# Patient Record
Sex: Female | Born: 1940 | Race: Black or African American | Hispanic: No | Smoking: Never smoker
Health system: Southern US, Community
[De-identification: ages and names within clinical notes are randomized; demographics above are authoritative.]

## PROBLEM LIST (undated history)

## (undated) DIAGNOSIS — I10 Essential (primary) hypertension: Secondary | ICD-10-CM

## (undated) DIAGNOSIS — F419 Anxiety disorder, unspecified: Secondary | ICD-10-CM

## (undated) HISTORY — PX: CHOLECYSTECTOMY: SHX55

## (undated) HISTORY — PX: BACK SURGERY: SHX140

## (undated) HISTORY — PX: JOINT REPLACEMENT: SHX530

---

## 1998-03-19 ENCOUNTER — Emergency Department (HOSPITAL_COMMUNITY): Admission: EM | Admit: 1998-03-19 | Discharge: 1998-03-19 | Payer: Self-pay | Admitting: Emergency Medicine

## 1998-04-07 ENCOUNTER — Ambulatory Visit (HOSPITAL_COMMUNITY): Admission: RE | Admit: 1998-04-07 | Discharge: 1998-04-07 | Payer: Self-pay | Admitting: *Deleted

## 1998-04-12 ENCOUNTER — Ambulatory Visit (HOSPITAL_COMMUNITY): Admission: RE | Admit: 1998-04-12 | Discharge: 1998-04-12 | Payer: Self-pay | Admitting: *Deleted

## 1998-12-13 ENCOUNTER — Ambulatory Visit (HOSPITAL_COMMUNITY): Admission: RE | Admit: 1998-12-13 | Discharge: 1998-12-13 | Payer: Self-pay | Admitting: *Deleted

## 1998-12-26 ENCOUNTER — Ambulatory Visit (HOSPITAL_COMMUNITY): Admission: RE | Admit: 1998-12-26 | Discharge: 1998-12-26 | Payer: Self-pay | Admitting: *Deleted

## 1998-12-26 ENCOUNTER — Encounter: Payer: Self-pay | Admitting: *Deleted

## 1998-12-27 ENCOUNTER — Ambulatory Visit (HOSPITAL_COMMUNITY): Admission: RE | Admit: 1998-12-27 | Discharge: 1998-12-27 | Payer: Self-pay | Admitting: *Deleted

## 1999-01-15 ENCOUNTER — Emergency Department (HOSPITAL_COMMUNITY): Admission: EM | Admit: 1999-01-15 | Discharge: 1999-01-15 | Payer: Self-pay | Admitting: Emergency Medicine

## 1999-02-10 ENCOUNTER — Ambulatory Visit (HOSPITAL_COMMUNITY): Admission: RE | Admit: 1999-02-10 | Discharge: 1999-02-10 | Payer: Self-pay | Admitting: *Deleted

## 1999-03-03 ENCOUNTER — Encounter (HOSPITAL_BASED_OUTPATIENT_CLINIC_OR_DEPARTMENT_OTHER): Payer: Self-pay | Admitting: General Surgery

## 1999-03-08 ENCOUNTER — Ambulatory Visit (HOSPITAL_COMMUNITY): Admission: RE | Admit: 1999-03-08 | Discharge: 1999-03-09 | Payer: Self-pay | Admitting: General Surgery

## 1999-03-10 ENCOUNTER — Emergency Department (HOSPITAL_COMMUNITY): Admission: EM | Admit: 1999-03-10 | Discharge: 1999-03-10 | Payer: Self-pay | Admitting: Emergency Medicine

## 1999-03-13 ENCOUNTER — Emergency Department (HOSPITAL_COMMUNITY): Admission: EM | Admit: 1999-03-13 | Discharge: 1999-03-13 | Payer: Self-pay | Admitting: Emergency Medicine

## 1999-04-12 ENCOUNTER — Encounter: Admission: RE | Admit: 1999-04-12 | Discharge: 1999-04-12 | Payer: Self-pay | Admitting: Neurosurgery

## 1999-05-02 ENCOUNTER — Encounter: Payer: Self-pay | Admitting: Neurosurgery

## 1999-05-02 ENCOUNTER — Ambulatory Visit (HOSPITAL_COMMUNITY): Admission: RE | Admit: 1999-05-02 | Discharge: 1999-05-02 | Payer: Self-pay | Admitting: Neurosurgery

## 1999-05-05 ENCOUNTER — Ambulatory Visit (HOSPITAL_COMMUNITY): Admission: RE | Admit: 1999-05-05 | Discharge: 1999-05-05 | Payer: Self-pay | Admitting: General Surgery

## 1999-05-16 ENCOUNTER — Ambulatory Visit (HOSPITAL_COMMUNITY): Admission: RE | Admit: 1999-05-16 | Discharge: 1999-05-16 | Payer: Self-pay | Admitting: Neurosurgery

## 1999-05-16 ENCOUNTER — Encounter: Payer: Self-pay | Admitting: Neurosurgery

## 1999-06-02 ENCOUNTER — Ambulatory Visit (HOSPITAL_COMMUNITY): Admission: RE | Admit: 1999-06-02 | Discharge: 1999-06-02 | Payer: Self-pay | Admitting: Neurosurgery

## 1999-06-02 ENCOUNTER — Encounter: Payer: Self-pay | Admitting: Neurosurgery

## 1999-06-09 ENCOUNTER — Encounter: Payer: Self-pay | Admitting: Dentistry

## 1999-06-09 ENCOUNTER — Ambulatory Visit (HOSPITAL_COMMUNITY): Admission: RE | Admit: 1999-06-09 | Discharge: 1999-06-09 | Payer: Self-pay | Admitting: Dentistry

## 1999-07-04 ENCOUNTER — Encounter (INDEPENDENT_AMBULATORY_CARE_PROVIDER_SITE_OTHER): Payer: Self-pay | Admitting: *Deleted

## 1999-07-04 ENCOUNTER — Ambulatory Visit (HOSPITAL_COMMUNITY): Admission: RE | Admit: 1999-07-04 | Discharge: 1999-07-04 | Payer: Self-pay | Admitting: General Surgery

## 1999-07-08 ENCOUNTER — Encounter: Payer: Self-pay | Admitting: Emergency Medicine

## 1999-07-08 ENCOUNTER — Emergency Department (HOSPITAL_COMMUNITY): Admission: EM | Admit: 1999-07-08 | Discharge: 1999-07-08 | Payer: Self-pay | Admitting: Emergency Medicine

## 1999-07-17 ENCOUNTER — Encounter: Payer: Self-pay | Admitting: Emergency Medicine

## 1999-07-17 ENCOUNTER — Emergency Department (HOSPITAL_COMMUNITY): Admission: EM | Admit: 1999-07-17 | Discharge: 1999-07-17 | Payer: Self-pay | Admitting: Emergency Medicine

## 1999-09-19 ENCOUNTER — Encounter: Payer: Self-pay | Admitting: Internal Medicine

## 1999-09-19 ENCOUNTER — Ambulatory Visit (HOSPITAL_COMMUNITY): Admission: RE | Admit: 1999-09-19 | Discharge: 1999-09-19 | Payer: Self-pay | Admitting: Internal Medicine

## 2000-01-20 ENCOUNTER — Emergency Department (HOSPITAL_COMMUNITY): Admission: EM | Admit: 2000-01-20 | Discharge: 2000-01-20 | Payer: Self-pay | Admitting: Emergency Medicine

## 2000-01-20 ENCOUNTER — Encounter: Payer: Self-pay | Admitting: Emergency Medicine

## 2000-01-23 ENCOUNTER — Ambulatory Visit (HOSPITAL_COMMUNITY): Admission: RE | Admit: 2000-01-23 | Discharge: 2000-01-23 | Payer: Self-pay | Admitting: *Deleted

## 2000-02-29 ENCOUNTER — Encounter: Payer: Self-pay | Admitting: Orthopedic Surgery

## 2000-02-29 ENCOUNTER — Encounter: Admission: RE | Admit: 2000-02-29 | Discharge: 2000-02-29 | Payer: Self-pay | Admitting: Orthopedic Surgery

## 2000-03-09 ENCOUNTER — Encounter: Admission: RE | Admit: 2000-03-09 | Discharge: 2000-03-09 | Payer: Self-pay | Admitting: Internal Medicine

## 2000-03-11 ENCOUNTER — Encounter: Admission: RE | Admit: 2000-03-11 | Discharge: 2000-03-11 | Payer: Self-pay | Admitting: Internal Medicine

## 2000-03-11 ENCOUNTER — Encounter: Payer: Self-pay | Admitting: Internal Medicine

## 2000-03-14 ENCOUNTER — Ambulatory Visit (HOSPITAL_COMMUNITY): Admission: RE | Admit: 2000-03-14 | Discharge: 2000-03-14 | Payer: Self-pay | Admitting: *Deleted

## 2000-06-13 ENCOUNTER — Encounter: Payer: Self-pay | Admitting: Internal Medicine

## 2000-06-13 ENCOUNTER — Encounter: Admission: RE | Admit: 2000-06-13 | Discharge: 2000-06-13 | Payer: Self-pay | Admitting: Internal Medicine

## 2000-07-18 ENCOUNTER — Encounter: Payer: Self-pay | Admitting: Orthopedic Surgery

## 2000-07-18 ENCOUNTER — Encounter: Admission: RE | Admit: 2000-07-18 | Discharge: 2000-07-18 | Payer: Self-pay | Admitting: Internal Medicine

## 2000-08-27 ENCOUNTER — Ambulatory Visit (HOSPITAL_COMMUNITY): Admission: RE | Admit: 2000-08-27 | Discharge: 2000-08-27 | Payer: Self-pay | Admitting: Orthopedic Surgery

## 2000-09-03 ENCOUNTER — Encounter: Admission: RE | Admit: 2000-09-03 | Discharge: 2000-12-02 | Payer: Self-pay | Admitting: Anesthesiology

## 2000-12-21 ENCOUNTER — Emergency Department (HOSPITAL_COMMUNITY): Admission: EM | Admit: 2000-12-21 | Discharge: 2000-12-21 | Payer: Self-pay | Admitting: Emergency Medicine

## 2000-12-24 ENCOUNTER — Other Ambulatory Visit: Admission: RE | Admit: 2000-12-24 | Discharge: 2000-12-24 | Payer: Self-pay | Admitting: *Deleted

## 2000-12-31 ENCOUNTER — Encounter: Admission: RE | Admit: 2000-12-31 | Discharge: 2000-12-31 | Payer: Self-pay | Admitting: Orthopedic Surgery

## 2000-12-31 ENCOUNTER — Encounter: Payer: Self-pay | Admitting: Orthopedic Surgery

## 2001-01-13 ENCOUNTER — Ambulatory Visit (HOSPITAL_COMMUNITY): Admission: RE | Admit: 2001-01-13 | Discharge: 2001-01-13 | Payer: Self-pay | Admitting: Orthopedic Surgery

## 2001-01-13 ENCOUNTER — Encounter: Payer: Self-pay | Admitting: Orthopedic Surgery

## 2001-01-22 ENCOUNTER — Encounter: Admission: RE | Admit: 2001-01-22 | Discharge: 2001-01-22 | Payer: Self-pay | Admitting: Internal Medicine

## 2001-01-30 ENCOUNTER — Emergency Department (HOSPITAL_COMMUNITY): Admission: EM | Admit: 2001-01-30 | Discharge: 2001-01-31 | Payer: Self-pay | Admitting: Emergency Medicine

## 2001-01-31 ENCOUNTER — Encounter: Payer: Self-pay | Admitting: Emergency Medicine

## 2001-04-14 ENCOUNTER — Emergency Department (HOSPITAL_COMMUNITY): Admission: EM | Admit: 2001-04-14 | Discharge: 2001-04-15 | Payer: Self-pay | Admitting: Emergency Medicine

## 2001-04-15 ENCOUNTER — Encounter: Payer: Self-pay | Admitting: Emergency Medicine

## 2001-05-02 ENCOUNTER — Encounter (INDEPENDENT_AMBULATORY_CARE_PROVIDER_SITE_OTHER): Payer: Self-pay | Admitting: Specialist

## 2001-05-02 ENCOUNTER — Ambulatory Visit (HOSPITAL_COMMUNITY): Admission: RE | Admit: 2001-05-02 | Discharge: 2001-05-02 | Payer: Self-pay | Admitting: *Deleted

## 2001-08-26 ENCOUNTER — Encounter: Payer: Self-pay | Admitting: Family Medicine

## 2001-08-26 ENCOUNTER — Encounter: Admission: RE | Admit: 2001-08-26 | Discharge: 2001-08-26 | Payer: Self-pay | Admitting: Family Medicine

## 2001-08-28 ENCOUNTER — Ambulatory Visit (HOSPITAL_BASED_OUTPATIENT_CLINIC_OR_DEPARTMENT_OTHER): Admission: RE | Admit: 2001-08-28 | Discharge: 2001-08-28 | Payer: Self-pay | Admitting: Urology

## 2001-11-20 ENCOUNTER — Encounter: Admission: RE | Admit: 2001-11-20 | Discharge: 2001-11-20 | Payer: Self-pay | Admitting: Family Medicine

## 2001-11-20 ENCOUNTER — Encounter: Payer: Self-pay | Admitting: Family Medicine

## 2001-11-21 ENCOUNTER — Ambulatory Visit: Admission: RE | Admit: 2001-11-21 | Discharge: 2001-11-21 | Payer: Self-pay | Admitting: *Deleted

## 2001-11-28 ENCOUNTER — Ambulatory Visit (HOSPITAL_COMMUNITY): Admission: RE | Admit: 2001-11-28 | Discharge: 2001-11-28 | Payer: Self-pay | Admitting: *Deleted

## 2002-01-10 ENCOUNTER — Encounter: Payer: Self-pay | Admitting: Emergency Medicine

## 2002-01-10 ENCOUNTER — Emergency Department (HOSPITAL_COMMUNITY): Admission: EM | Admit: 2002-01-10 | Discharge: 2002-01-10 | Payer: Self-pay | Admitting: Emergency Medicine

## 2002-04-09 ENCOUNTER — Ambulatory Visit (HOSPITAL_COMMUNITY): Admission: RE | Admit: 2002-04-09 | Discharge: 2002-04-09 | Payer: Self-pay | Admitting: Internal Medicine

## 2002-04-09 ENCOUNTER — Encounter: Payer: Self-pay | Admitting: Internal Medicine

## 2002-06-15 ENCOUNTER — Encounter: Payer: Self-pay | Admitting: Urology

## 2002-06-15 ENCOUNTER — Ambulatory Visit (HOSPITAL_BASED_OUTPATIENT_CLINIC_OR_DEPARTMENT_OTHER): Admission: RE | Admit: 2002-06-15 | Discharge: 2002-06-15 | Payer: Self-pay | Admitting: Urology

## 2003-10-13 ENCOUNTER — Encounter: Admission: RE | Admit: 2003-10-13 | Discharge: 2003-10-13 | Payer: Self-pay | Admitting: Family Medicine

## 2003-11-27 ENCOUNTER — Encounter: Admission: RE | Admit: 2003-11-27 | Discharge: 2003-11-27 | Payer: Self-pay | Admitting: Orthopedic Surgery

## 2003-12-11 ENCOUNTER — Encounter: Admission: RE | Admit: 2003-12-11 | Discharge: 2003-12-11 | Payer: Self-pay | Admitting: Orthopedic Surgery

## 2003-12-22 ENCOUNTER — Ambulatory Visit (HOSPITAL_BASED_OUTPATIENT_CLINIC_OR_DEPARTMENT_OTHER): Admission: RE | Admit: 2003-12-22 | Discharge: 2003-12-22 | Payer: Self-pay | Admitting: Orthopedic Surgery

## 2003-12-22 ENCOUNTER — Ambulatory Visit (HOSPITAL_COMMUNITY): Admission: RE | Admit: 2003-12-22 | Discharge: 2003-12-22 | Payer: Self-pay | Admitting: Orthopedic Surgery

## 2004-01-04 ENCOUNTER — Encounter: Admission: RE | Admit: 2004-01-04 | Discharge: 2004-03-10 | Payer: Self-pay | Admitting: Orthopedic Surgery

## 2004-02-24 ENCOUNTER — Encounter: Admission: RE | Admit: 2004-02-24 | Discharge: 2004-02-24 | Payer: Self-pay | Admitting: Orthopedic Surgery

## 2004-04-05 ENCOUNTER — Ambulatory Visit (HOSPITAL_BASED_OUTPATIENT_CLINIC_OR_DEPARTMENT_OTHER): Admission: RE | Admit: 2004-04-05 | Discharge: 2004-04-05 | Payer: Self-pay | Admitting: Orthopedic Surgery

## 2004-04-27 ENCOUNTER — Encounter: Admission: RE | Admit: 2004-04-27 | Discharge: 2004-05-15 | Payer: Self-pay | Admitting: Orthopedic Surgery

## 2004-05-10 ENCOUNTER — Encounter: Admission: RE | Admit: 2004-05-10 | Discharge: 2004-05-10 | Payer: Self-pay | Admitting: Gastroenterology

## 2004-05-11 ENCOUNTER — Encounter (INDEPENDENT_AMBULATORY_CARE_PROVIDER_SITE_OTHER): Payer: Self-pay | Admitting: *Deleted

## 2004-05-11 ENCOUNTER — Ambulatory Visit (HOSPITAL_COMMUNITY): Admission: RE | Admit: 2004-05-11 | Discharge: 2004-05-11 | Payer: Self-pay | Admitting: Gastroenterology

## 2004-06-05 ENCOUNTER — Ambulatory Visit (HOSPITAL_COMMUNITY): Admission: RE | Admit: 2004-06-05 | Discharge: 2004-06-05 | Payer: Self-pay | Admitting: Gastroenterology

## 2005-01-16 ENCOUNTER — Encounter: Admission: RE | Admit: 2005-01-16 | Discharge: 2005-01-16 | Payer: Self-pay | Admitting: Orthopedic Surgery

## 2005-02-02 ENCOUNTER — Ambulatory Visit (HOSPITAL_BASED_OUTPATIENT_CLINIC_OR_DEPARTMENT_OTHER): Admission: RE | Admit: 2005-02-02 | Discharge: 2005-02-02 | Payer: Self-pay | Admitting: Orthopedic Surgery

## 2005-02-02 ENCOUNTER — Ambulatory Visit (HOSPITAL_COMMUNITY): Admission: RE | Admit: 2005-02-02 | Discharge: 2005-02-02 | Payer: Self-pay | Admitting: Orthopedic Surgery

## 2005-02-14 ENCOUNTER — Ambulatory Visit (HOSPITAL_COMMUNITY): Admission: RE | Admit: 2005-02-14 | Discharge: 2005-02-14 | Payer: Self-pay | Admitting: Orthopedic Surgery

## 2005-05-18 ENCOUNTER — Encounter: Admission: RE | Admit: 2005-05-18 | Discharge: 2005-05-18 | Payer: Self-pay | Admitting: Family Medicine

## 2005-11-13 ENCOUNTER — Encounter: Admission: RE | Admit: 2005-11-13 | Discharge: 2005-11-13 | Payer: Self-pay | Admitting: Family Medicine

## 2006-04-15 ENCOUNTER — Inpatient Hospital Stay (HOSPITAL_COMMUNITY): Admission: RE | Admit: 2006-04-15 | Discharge: 2006-04-23 | Payer: Self-pay | Admitting: Neurological Surgery

## 2006-04-22 ENCOUNTER — Ambulatory Visit: Payer: Self-pay | Admitting: Physical Medicine & Rehabilitation

## 2006-05-07 ENCOUNTER — Emergency Department (HOSPITAL_COMMUNITY): Admission: EM | Admit: 2006-05-07 | Discharge: 2006-05-07 | Payer: Self-pay | Admitting: Emergency Medicine

## 2006-12-25 ENCOUNTER — Encounter: Admission: RE | Admit: 2006-12-25 | Discharge: 2006-12-25 | Payer: Self-pay | Admitting: Orthopedic Surgery

## 2007-02-10 ENCOUNTER — Ambulatory Visit (HOSPITAL_COMMUNITY): Admission: RE | Admit: 2007-02-10 | Discharge: 2007-02-10 | Payer: Self-pay | Admitting: Orthopedic Surgery

## 2007-02-10 ENCOUNTER — Ambulatory Visit: Payer: Self-pay | Admitting: Vascular Surgery

## 2007-02-10 ENCOUNTER — Encounter (INDEPENDENT_AMBULATORY_CARE_PROVIDER_SITE_OTHER): Payer: Self-pay | Admitting: Orthopedic Surgery

## 2007-03-13 ENCOUNTER — Encounter: Admission: RE | Admit: 2007-03-13 | Discharge: 2007-03-13 | Payer: Self-pay | Admitting: Internal Medicine

## 2007-07-11 ENCOUNTER — Inpatient Hospital Stay (HOSPITAL_COMMUNITY): Admission: RE | Admit: 2007-07-11 | Discharge: 2007-07-14 | Payer: Self-pay | Admitting: Orthopedic Surgery

## 2007-07-13 ENCOUNTER — Encounter (INDEPENDENT_AMBULATORY_CARE_PROVIDER_SITE_OTHER): Payer: Self-pay | Admitting: Orthopedic Surgery

## 2007-07-13 ENCOUNTER — Ambulatory Visit: Payer: Self-pay | Admitting: Vascular Surgery

## 2007-08-05 ENCOUNTER — Encounter: Admission: RE | Admit: 2007-08-05 | Discharge: 2007-08-28 | Payer: Self-pay | Admitting: Orthopedic Surgery

## 2008-06-09 ENCOUNTER — Ambulatory Visit (HOSPITAL_COMMUNITY): Admission: RE | Admit: 2008-06-09 | Discharge: 2008-06-09 | Payer: Self-pay | Admitting: Neurological Surgery

## 2008-07-06 ENCOUNTER — Encounter: Admission: RE | Admit: 2008-07-06 | Discharge: 2008-07-06 | Payer: Self-pay | Admitting: Gastroenterology

## 2008-07-23 ENCOUNTER — Ambulatory Visit (HOSPITAL_COMMUNITY): Admission: RE | Admit: 2008-07-23 | Discharge: 2008-07-23 | Payer: Self-pay | Admitting: Gastroenterology

## 2009-07-21 HISTORY — PX: TENOTOMY / FLEXOR TENDON TRANSFER: SHX6629

## 2009-07-21 HISTORY — PX: CAPSULOTOMY: SHX379

## 2009-07-21 HISTORY — PX: BUNIONECTOMY: SHX129

## 2009-07-21 HISTORY — PX: OTHER SURGICAL HISTORY: SHX169

## 2010-03-16 ENCOUNTER — Encounter: Admission: RE | Admit: 2010-03-16 | Discharge: 2010-03-16 | Payer: Self-pay | Admitting: Orthopedic Surgery

## 2010-03-21 ENCOUNTER — Encounter: Admission: RE | Admit: 2010-03-21 | Discharge: 2010-03-23 | Payer: Self-pay | Admitting: Orthopedic Surgery

## 2010-07-20 ENCOUNTER — Inpatient Hospital Stay (HOSPITAL_COMMUNITY)
Admission: EM | Admit: 2010-07-20 | Discharge: 2010-07-23 | Payer: Self-pay | Source: Home / Self Care | Attending: Internal Medicine | Admitting: Internal Medicine

## 2010-07-21 NOTE — H&P (Signed)
Brooke [name], Brooke [name]               ACCOUNT NO.:  [id number]  MEDICAL RECORD NO.:  [id number]          PATIENT TYPE:  INP  LOCATION:  1826                         FACILITY:  [facility]  PHYSICIAN:  Brooke Dick, MD    DATE OF BIRTH:  07-01-1941  DATE OF ADMISSION:  07/20/2010 DATE OF DISCHARGE:                             HISTORY & PHYSICAL   PRIMARY CARE PHYSICIAN:  Brooke [name]. Brooke [name], M.D.  Patient seen and examined in the emergency room.  CHIEF COMPLAINT:  Chest/epigastric pain.  HISTORY OF PRESENT ILLNESS:  Brooke [name] is a 70 year old pleasant lady with a past medical history significant for gastroesophageal reflux disease, hiatal hernia, fibromyalgia, osteoarthritis, dyslipidemia, osteoporosis and insomnia who also has had multiple surgeries for osteoarthritis including knee replacement, laminectomy at L2, L3, L4, partial lateral meniscectomy.  This patient says she was doing well until about early this morning when she woke up and found that she was having chest pain which was mostly substernal and also slightly epigastric.  She described the chest pain as being a little bit sharp but sometime also felt like some pressure.  She also said the chest pain really got worse when she took a deep breath and because of it she is unable to breathe in deep.  The pain was nonradiating.  Intensity was about 10/10 when it was really bad.  She said she did not have any nausea or vomiting even though she had some nausea when she was given aspirin in the ambulance.  She also noted the fact that the aspirin and the nitroglycerin given to her did not help the pain whatsoever.  This patient denies any paroxysmal nocturnal dyspnea.  Denies orthopnea.  The patient also denies diaphoresis.  REVIEW OF SYSTEMS:  The patient denies dysuria, frequency and urgency. Denies palpitation.  Denies photophobia, diplopia and syncope.  Denies diarrhea but says she has some constipation secondary to  irritable bowel syndrome.  PAST MEDICAL HISTORY:  Osteoarthritis with total right knee replacement, hiatal hernia, fibromyalgia, gastroesophageal reflux disease, dyslipidemia, osteoporosis, insomnia, irritable bowel syndrome with diarrhea alternating with constipation.  MEDICATIONS: 1. Crestor 10 mg daily. 2. Calcium over-the-counter 1 tablet daily. 3. Vitamin D 1 tablet daily. 4. Tramadol 50 mg daily as needed for pain. 5. Carisoprodol 250 mg daily. 6. Xanax 0.5 mg daily at bedtime. 7. Ambien 10 mg daily at bedtime.  ALLERGIES:  The patient is allergic to DEMEROL and CODEINE which causes itching.  She is also mentioned to have allergy to IBUPROFEN and ASPIRIN but both of them only cause nausea and vomiting and I have discussed this with her that this is more a side effect than a true allergy.  SOCIAL HISTORY:  The patient lives alone.  Denies tobacco, alcohol, illicit drug use.  FAMILY HISTORY:  Significant for a father who had a heart attack in his 55s and a brother also had a heart attack but a light one in his 74s, otherwise there is also diabetes mellitus in the family but no high blood pressure, according to her.  PHYSICAL EXAMINATION:  GENERAL:  The patient seen and examined in the emergency room.  She is alert and oriented x3, no acute cardiopulmonary distress. VITAL SIGNS:  Stable except for a heart rate of 102. HEENT:  Normocephalic, atraumatic.  Pupils equal, round, reactive to light.  Extraocular muscles intact.  Nares patent. NECK:  Supple.  No JVD.  No lymphadenopathy or thyromegaly. CHEST:  Clear to auscultation bilaterally.  No rhonchi.  No rales.  No wheezing.  The patient seems to be in pain when she takes in a deep breath and has to stop. ABDOMEN:  Soft, nontender.  No hepatosplenomegaly. EXTREMITIES:  No clubbing or cyanosis.  No edema. CARDIOVASCULAR:  First and second heart sounds only. CENTRAL NERVOUS SYSTEM:  Nonfocal.  Cranial nerves intact II-XII.   The patient has normal reflexes and power is 5/5 globally.  LABS AND INVESTIGATIONS:  WBC 15.8, hemoglobin 12, hematocrit 37, platelet 204 with 81% neutrophils.  D-dimer is 0.70.  Potassium is 3.4, sodium 139, glucose 130, BUN 14, creatinine 0.84.  Troponin less than 0.01. CK-MB 0.9.  Urinalysis shows large leukocyte esterase, 7-10 WBCs and few bacteria.  Chest x-ray is read as normal.  CT angiogram of the chest also done read as no CT findings for pulmonary embolism, normal thoracic aorta except for atherosclerotic changes, coronary artery calcification, no significant pulmonary findings and bibasilar atelectasis noted.  ASSESSMENT: 1. Chest pain, sounds atypical, questionable for pleurisy versus for     gastroesophageal reflux disease. 2. Gastroesophageal reflux disease. 3. Leukocytosis with questionable etiology. 4. Hiatal hernia. 5. Irritable bowel syndrome. 6. Osteoarthritis. 7. Fibromyalgia. 8. Insomnia. 9. Dyslipidemia. 10.Urinary tract infection.  PLAN OF CARE:  This patient will be admitted to the hospital and we are going to treat her empirically.  We will start on IV Protonix for now and then maybe in a day or so transition this to oral Protonix.  We will give this to her twice a day.  I will also hold off on aspirin for now since she is nauseated and this does not seem to be cardiac-related, at least at this point.  In any case, she has got 1 dose of aspirin already and we are in the process of cycling her cardiac enzymes.  If for any reason her cardiac enzymes shows any reason, then she may get the next dose of aspirin which will be due tomorrow anyway.  If cardiac enzymes turn out to be negative, then the rounding MD will now consider inpatient stress test versus outpatient stress test for this patient just to evaluate the cardiac angle fully.  For the patient's urinary tract infection and questionable pleurisy, I am going to give her Levaquin which should cover  both entities and we will take it from there.  I will also ask pharmacy to allow Levaquin for this patient.  A 2-D echo will be done.  DVT prophylaxis will be provided.  We will check lipid profile.  Significantly, she has been ruled out for pulmonary embolism and dissecting aortic aneurysm.    Total time used for admitting this patient is 1 hour.  Plan of care discussed in detail with the patient at length.  She is in agreement and plans to comply.     Brooke Dick, MD     GU/MEDQ  D:  07/20/2010  T:  07/20/2010  Job:  [id number]  cc:   Brooke [name]. Brooke [name], M.D.  Electronically Signed by Brooke [name]  on 07/20/2010 07:00:07 PM

## 2010-07-23 ENCOUNTER — Encounter: Payer: Self-pay | Admitting: Orthopedic Surgery

## 2010-07-24 LAB — DIFFERENTIAL
Basophils Absolute: 0 10*3/uL (ref 0.0–0.1)
Basophils Relative: 0 % (ref 0–1)
Eosinophils Relative: 0 % (ref 0–5)
Monocytes Absolute: 1.3 10*3/uL — ABNORMAL HIGH (ref 0.1–1.0)

## 2010-07-24 LAB — CBC
MCHC: 32.6 g/dL (ref 30.0–36.0)
MCV: 93.8 fL (ref 78.0–100.0)
Platelets: 204 10*3/uL (ref 150–400)
Platelets: 234 10*3/uL (ref 150–400)
RBC: 4.04 MIL/uL (ref 3.87–5.11)
RDW: 12.9 % (ref 11.5–15.5)
WBC: 11.8 10*3/uL — ABNORMAL HIGH (ref 4.0–10.5)

## 2010-07-24 LAB — URINE MICROSCOPIC-ADD ON

## 2010-07-24 LAB — URINALYSIS, ROUTINE W REFLEX MICROSCOPIC
Bilirubin Urine: NEGATIVE
Specific Gravity, Urine: 1.014 (ref 1.005–1.030)
Urobilinogen, UA: 0.2 mg/dL (ref 0.0–1.0)

## 2010-07-24 LAB — CARDIAC PANEL(CRET KIN+CKTOT+MB+TROPI)
CK, MB: 0.9 ng/mL (ref 0.3–4.0)
CK, MB: 1.9 ng/mL (ref 0.3–4.0)
Relative Index: INVALID (ref 0.0–2.5)
Total CK: 81 U/L (ref 7–177)
Total CK: 83 U/L (ref 7–177)
Troponin I: 0.01 ng/mL (ref 0.00–0.06)

## 2010-07-24 LAB — BASIC METABOLIC PANEL
Calcium: 8.3 mg/dL — ABNORMAL LOW (ref 8.4–10.5)
Chloride: 108 mEq/L (ref 96–112)
GFR calc Af Amer: >60 mL/min (ref >60)
GFR calc Af Amer: >60 mL/min (ref >60)
GFR calc non Af Amer: >60 mL/min (ref >60)
Glucose, Bld: 130 mg/dL — ABNORMAL HIGH (ref 70–99)
Potassium: 4.5 mEq/L (ref 3.5–5.1)
Sodium: 139 mEq/L (ref 135–145)
Sodium: 139 mEq/L (ref 135–145)

## 2010-07-24 LAB — CK TOTAL AND CKMB (NOT AT ARMC)
CK, MB: 1 ng/mL (ref 0.3–4.0)
Relative Index: INVALID (ref 0.0–2.5)
Relative Index: 0.8 (ref 0.0–2.5)
Total CK: 83 U/L (ref 7–177)

## 2010-07-24 LAB — MAGNESIUM: Magnesium: 1.9 mg/dL (ref 1.5–2.5)

## 2010-07-24 LAB — D-DIMER, QUANTITATIVE: D-Dimer, Quant: 0.7 ug/mL-FEU — ABNORMAL HIGH (ref 0.00–0.48)

## 2010-07-31 NOTE — Consult Note (Signed)
Brooke [name], [name]               ACCOUNT NO.:  [id number]  MEDICAL RECORD NO.:  [id number]          PATIENT TYPE:  INP  LOCATION:  3740                         FACILITY:  [facility]  PHYSICIAN:  Jesse Sans. Livy Ross, MD, FACCDATE OF BIRTH:  17-May-1941  DATE OF CONSULTATION: DATE OF DISCHARGE:                                CONSULTATION   I was asked by Dr. Toniann Fail of the [facility] to consult on Brooke [name] with chest discomfort.  Brooke [name] is a 70 year old female who has a history of gastroesophageal reflux disease, hiatal hernia, fibromyalgia, chronic insomnia, and hyperlipidemia.  She awoke early the morning of admission on July 20, 2010 with chest discomfort.  It is mostly subxiphoid and epigastric.  The discomfort was sharp.  It is clearly now worse with taking a breath or any movement at all.  There was no radiation discomfort.  There was no nausea or vomiting, diaphoresis or shortness of breath.  She has shortness of breath when she gets up in the hospital to go to the restroom.  She was given 3 nitroglycerin en route by EMS with no help whatsoever.  Her EKG is normal.  Cardiac markers have been negative.  Chest CT was done to rule out pulmonary embolus which was negative.  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  In addition, she had mild atherosclerosis of the aorta but no evidence of aortic dilatation.  There was also some coronary calcification.  There was no pulmonary embolus.  She continues to have pain even at rest, any movement again makes it worse.  PAST MEDICAL HISTORY:  Her cardiac risk factors are pertinent for age, and hyperlipidemia.  She is on Crestor.  Lipitor cause myalgias.  She does not smoke or use any illicit drugs.  No history of diabetes. No premature history of coronary disease.  Her medications on  admission included: 1. Crestor 10 mg a day. 2. Calcium over-the-counter 1 tablet a day. 3. Vitamin D 1 tablet a day. 4. Tramadol 50 mg daily as needed for pain. 5. Carisoprodol 250 mg daily. 6. Xanax 0.5 mg daily at bedtime. 7. Ambien 10 mg daily at bedtime.  She is allergic to DEMEROL and CODEINE which causes itching.  She is intolerant of LIPITOR.  She also cannot tolerate ASPIRIN, IBUPROFEN without having nausea and some sickness on her stomach.  SOCIAL HISTORY:  She lives alone.  She denies any alcohol, tobacco, or illicit drugs.  FAMILY HISTORY:  Significant for father who had a heart attack in his 42s.  Brother had a heart attack in his 35s.  There is a history of diabetes.  PAST SURGICAL HISTORY:  She has had multiple operations for severe osteoarthritis including knee replacement, laminectomy of L2, L3, L4, and a partial lateral meniscectomy.  REVIEW OF SYSTEMS:  Other than the HPI is negative.  She specifically has had no melena or hematochezia.  She does have some problems swallowing at times.  She also denies any hematemesis.  Rest review of systems are  negative.  PHYSICAL EXAMINATION:  GENERAL:  She is a chronically ill-looking lady who is groaning in pain.  She is holding epigastric area, in general is uncomfortable. VITAL SIGNS:  Stable with blood pressure 118/78, her heart rate is 100 and regular.  She is in sinus tach on EKG and telemetry.  Temp was 99.6, sat was 99% on 2 liters. HEENT:  Sclerae nonicteric.  PERRLA.  Extraocular movements intact. Facial symmetry is normal.  Carotids are full without bruits.  Thyroid is not enlarged.  Trachea is midline. NECK:  Supple. LUNGS:  Clear to auscultation and percussion.  No rub. HEART:  Reveals a nondisplaced PMI.  Normal S1 and S2.  No rub.  S2 splits normally.  There is no murmur. ABDOMEN:  She has tender to touch to the lower sternal region, subxiphoid region, and epigastric region.  Bowel sounds are  present. She is nondistended.  I do not perform any deep palpation because she was uncomfortable. EXTREMITIES:  Reveal no cyanosis, clubbing, or edema.  Pulses are intact. NEUROLOGIC:  Intact.  All laboratory data, x-rays including CT angio and EKGs reviewed.  ASSESSMENT: 1. Subxiphoid and epigastric pain and tenderness, expect a combination     of gastroesophageal reflux disease and muscular spasm or pain.  Her     discomfort is not consistent with angina or coronary ischemia.  She     does have coronary calcification, but not surprising in her age     with history of hyperlipidemia.  She clearly has subclinical     coronary artery disease, but I would suspect nonobstructive     disease.  She has mild aortic atherosclerosis as well.  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  I do not feel this as a cause of     pain.  RECOMMENDATIONS: 1. Discontinue IV nitro, aspirin, and heparin. 2. Continue statin. 3. EGD may be indicated if persistent pain continues.  She is on a PPI     now b.i.d. which I agree with.  We will be available for questions.  Thank you for the consultation.   Evynn Boutelle C. Daleen Squibb, MD, [facility]     TCW/MEDQ  D:  07/21/2010  T:  07/22/2010  Job:  [id number]  Electronically Signed by Valera Castle MD [facility] on 07/31/2010 11:00:39 AM

## 2010-08-05 NOTE — Discharge Summary (Signed)
Brooke [name], Brooke [name]               ACCOUNT NO.:  [id number]  MEDICAL RECORD NO.:  [id number]          PATIENT TYPE:  INP  LOCATION:  3740                         FACILITY:  [facility]  PHYSICIAN:  Marinda Elk, M.D.DATE OF BIRTH:  1940-09-07  DATE OF ADMISSION:  07/20/2010 DATE OF DISCHARGE:  07/23/2010                              DISCHARGE SUMMARY   PRIMARY CARE DOCTOR:  [name], M.D.  GASTROENTEROLOGIST:  [name]. [name], MD  DISCHARGE DIAGNOSES: 1. Atypical chest pain, most likely secondary to gastric etiology. 2. Gastroesophageal reflux disease. 3. Hiatal hernia. 4. Urinary tract infection. 5. Fibromyalgia. 6. Hyperlipidemia.  DISCHARGE MEDICATIONS: 1. Tylenol 650 mg q.4 h. p.r.n. 2. Ciprofloxacin 500 mg p.o. b.i.d. 3. Metolazone 25 mg b.i.d. 4. Protonix 40 mg daily. 5. Ambien 10 mg at bedtime. 6. Calcium over-the-counter 1 tablet daily. 7. Carisoprodol 350 mg 1 tablet daily. 8. Crestor 10 mg every evening. 9. Tramadol 50 mg daily. 10.Vitamin D over the counter tablet daily. 11.Xanax 0.5 mg daily.  PROCEDURES PERFORMED: 1. 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  No significant pulmonary findings. 2. Chest x-ray showed no acute cardiopulmonary disease.  CONSULTANTS:  Thomas C. Daleen Squibb, MD, [facility], Cardiology.  BRIEF ADMITTING HISTORY AND PHYSICAL:  This is a 70 year old female with past medical history of GERD, hiatal hernia, fibromyalgia, status post knee replacement, who comes in for chest pain, most likely substernal, slightly epigastric.  She describes the chest pain as being little a bit sharp and sometimes, also felt some pressure.  She says chest pain radiates when she takes deep breath, is nonradiating, 10/10.  She did not have nausea or vomiting.  She also  noted the fact that aspirin and nitroglycerin given to her did not help this.  The patient denies PND. We were asked to admit him to further evaluate.  Please refer to dictation from July 20, 2010, for further details.  BRIEF HOSPITAL COURSE: 1. Atypical chest pain, most likely secondary to GERD, hiatal hernia.     Cardiac enzymes were cycled, they were negative x3.  Her EKG was     done which was normal.  Cardiology was consulted for a second     opinion.  They relayed this was most likely gastric.  She was put     on Protonix PPI.  She has been told to follow up with her primary     care doctor, Dr. Renae Gloss.  We referred her for an EGD as an     outpatient. 2. GERD.  Continue PPI and this was increased to b.i.d.  She has been     told to avoid NSAIDs. 3. UTI.  She was continuing Cipro and cultures are pending at the time     of the dictation. 4. Hiatal hernia.  Continue PPI twice a day.  __________ were made.  On the day of discharge, temperature is 99, pulse of  90, respirations 22, blood pressure 109/79.  She was satting 98% on 2 L.  Labs on the day of discharge show a hemoglobin A1c of 5.5.  TSH of 0.6.     Marinda Elk, M.D.     AF/MEDQ  D:  07/22/2010  T:  07/22/2010  Job:  [id number]  Electronically Signed by Lambert Keto M.D. on 08/05/2010 10:03:56 AM

## 2010-11-14 NOTE — Discharge Summary (Signed)
Brooke [name], [name]               ACCOUNT NO.:  [id number]   MEDICAL RECORD NO.:  [id number]          PATIENT TYPE:  INP   LOCATION:  5039                         FACILITY:  [facility]   PHYSICIAN:  Brooke [name], M.D.    DATE OF BIRTH:  May 31, 1941   DATE OF ADMISSION:  07/11/2007  DATE OF DISCHARGE:  07/14/2007                               DISCHARGE SUMMARY   ADMISSION DIAGNOSIS:  Osteoarthritis, right knee.   DISCHARGE DIAGNOSES:  1. Osteoarthritis, right knee.  2. Fibromyalgia.  3. Hiatal hernia.  4. Gastroesophageal reflux disease.  5. Increased cholesterol.  6. Osteoporosis.  7. Insomnia.   PROCEDURE:  Right total knee arthroplasty.   HISTORY:  A 70 year old female presents with a 5 to 10-year history of  gradual onset of progressive right knee pain.  She has had three knee  arthroscopies on the right, two in 2003 and one in 2008.  She denied any  history of injury or trauma.  Pain is constant, aching.  She has  radiation proximally and distal to the knee.  Difficulty with prolonged  sitting as well as work.  It is decreased with rest.  She does have  night time pain.  Cortisone injections as well as Reflexa with no  relief.  No assistive device at this time.  Using narcotics for pain  relief.  He was admitted for total joint replacement.   HOSPITAL COURSE:  A 70 year old female admitted July 11, 2007, after  appropriate laboratory studies were obtained as well as 1 gram Ancef IV  on call to the operating room.  She  was taken to the operating room  where she underwent a right total knee arthroplasty.  She tolerated the  procedure well.  She was continued on Ancef 1 gram IV q.6 h x3 doses.  Lovenox was started at 30 mg subcu q.12 h on July 12, 2007.  A  Dilaudid PCA pump was used in a reduced protocol.  Celebrex 200 mg p.o.  b.i.d. was begun as well as OxyContin 10 mg p.o. q.12 h.  CPM from 0-90  degrees for 6-8 hours per day was started.  Consults with PT, OT and  care management were made.  Weightbearing as tolerated on the right.  X-  ray of the right knee was ordered also.  Knee immobilizer to the right  knee when out of bed.   The following day, she is allowed out of bed to chair and weaned off of  her O2.  Doppler studies of the right calf were ordered.  These were  negative.  Remainder was hospital course was dedicated to ambulation and  physical therapy.  Once she was ambulatory and stable, she was  discharged.  Vascular studies revealed no evidence of DVT, superficial  thrombosis or Baker cyst.  A knee x-ray from July 11, 2007, revealed  right knee prosthesis in anatomic bone alignment.  Posterior changes are  noted.  Preop hemoglobin was 12.3, hematocrit 36.4%, white count 5500,  platelets 353,000.  Discharge hemoglobin 9.5, hematocrit 27.8%, white  count 7500, platelets 253,000.  Preop sodium 137, potassium  3.3,  chloride 107, CO2 of 25, glucose 172, BUN 9, creatinine 0.89, GFR  greater than 60. Total protein 6.9, albumin 4.1, AST 31, ALT 26,  alkaline phosphatase 66, and total bilirubin 0.5.  Discharge sodium 138,  potassium 3.5, chloride 105, CO2 27, glucose 102, BUN 6, creatinine  0.90, GFR greater than 60.  Urinalysis revealed trace hemoglobin, 0-2  white cells, 0-2 red cells with rare epithelials.  Blood type was O+,  antibody screen positive Anti-B with  IgG negative.   DISCHARGE INSTRUCTIONS:  No restriction on diet.  Keep incision clean  and dry.  Follow blue instruction sheet.  Change dressing daily.  Increase activity as tolerated.  Walker for weightbearing.  She is to be  weightbearing as tolerated.  No lifting or driving for 6 weeks.  CPM 0-  80 degrees, increase daily by 5-10 degrees to 100 degrees maximum.   DISCHARGE MEDICATIONS:  1. Prescription was for Percocet 5/325 one to 520 tablets every 4      hours as needed for pain.  2. Lovenox 40 mg take daily at 8:00 a.m. as instructed.  3. OxyContin 10 mg p.o. q. 12 h.   4. Celebrex 200 mg 1 tablet daily.  5. Zofran over-the-counter 1-2 tablets every 8 hours p.r.n. nausea.   FOLLOWUP:  She is to follow up with Dr. Madelon [name] on July 24, 2007.   Discharged in good condition.      Brooke [name], P.A.-C.      Brooke [name], M.D.  Electronically Signed    BDP/MEDQ  D:  08/11/2007  T:  08/11/2007  Job:  [id number]

## 2010-11-14 NOTE — Op Note (Signed)
NAMEVICTORINE, MCNEE               ACCOUNT NO.:  [id number]   MEDICAL RECORD NO.:  [id number]          PATIENT TYPE:  INP   LOCATION:  2550                         FACILITY:  [facility]   PHYSICIAN:  Dyke Brackett, M.D.    DATE OF BIRTH:  August 14, 1940   DATE OF PROCEDURE:  07/11/2007  DATE OF DISCHARGE:                               OPERATIVE REPORT   PRE-AND-POSTOP DIAGNOSIS:  Osteoarthritis right knee with varus  deformity and flexion contracture.   OPERATION:  Right total knee replacement, LCS cemented, standard patella  femur, 12.5 bearing with a 2.5 tibia.   SURGEON:  Dyke Brackett, M.D.   ASSISTANT:  Arlys John D. Petrarca, P.A.-C.   TOURNIQUET TIME:  Approximately 1 hour.   DESCRIPTION OF PROCEDURE:  Sterile prep and drape, exsanguination of the  leg, and placed in a tourniquet to 350.  A straight skin incision was  made with medial parapatellar approach to the knee made.  There was  varus deformity of the knee with a flexion contracture, requiring  stripping of the medial side of the knee, place release of the PCL.  The  tibia cut was made with a tibial block and a 7-degrees slope, 2.5 mm  below the most diseased medial compartment.  This was followed by an  anterior-posterior femoral cut with eventually the flexion gap measuring  at 12.5 mm, distal 4-degree valgus cut made off the femur, flexion gap  equalized the extension gap.   The attention was next directed to the tibia.  Complete release of the  PCL carried out followed by removal of the remnants of the menisci.  This was followed by cutting the keel hole for a 2.5 size tibia,  followed by a trial in the tibia and femur.  The patella was cut leaving  about 60 mm of native patella for a 3-peg patella.  The trial components  fit nicely with a 12.5 bearing, full extension, no instability noted,  and good stability in flexion.  The trial components were removed.  FloSeal was placed in the posterior capsule, as well as the  anterior  capsule.  Prior to this, the wound was irrigated.  The prosthesis was  inserted with antibiotic impregnated cement tibia, followed by femur  patella allowed to harden.   Trial bearing was placed prior to this, again, stability checked.  The  final components final bearing was placed as well with, again, good  stability noted.  Prior to inserting the tibial insert, the tourniquet  was released.  A small amount of bleeding was encountered posterior, it  was coagulated.  No excessive bleeding was noted.  Hemovac drain was  placed exiting superolaterally.  Closure was effected with interrupted  Ethibond followed by 2-0 Vicryl, and skin clips.  Placed in a lightly  compressive sterile dressing and knee immobilizer, taken to recovery  room in stable condition.      Dyke Brackett, M.D.  Electronically Signed    WDC/MEDQ  D:  07/11/2007  T:  07/11/2007  Job:  [id number]

## 2010-11-17 NOTE — Procedures (Signed)
[place]  Patient:    Brooke [name], Brooke [name]                      MRN: [id number] Proc. Date: 01/23/00 Adm. Date:  [id number] Attending:  Sharyn Dross                           Procedure Report  PREOPERATIVE DIAGNOSIS:  Dysphagia.  POSTOPERATIVE DIAGNOSIS: 1. Distal esophagitis, grade 1 to grade 2. 2. Hiatal hernia. 3. Antral gastritis with some erosions.  PROCEDURE PERFORMED:  Esophagogastroduodenoscopy.  MEDICATIONS USED:  Demerol 100 mg IV, Versed 10 mg IV over a 20 minute period of time.  INSTRUMENT USED:  Olympus video panendoscope.  ENDOSCOPIST:  Sharyn Dross., M.D.  INDICATIONS FOR PROCEDURE:  This pleasant female presented to the office with complaints of difficulty swallowing at this time.  She has generalized abdominal pains that was present as well as feeling of sensation of food getting stuck in the chest region at this time.  She was subsequently referred for an endoscopic examination to evaluate this process that was ongoing.  OBJECTIVE FINDINGS:  She is a pleasant female in no distress.  Her vital signs are stable.  Her HEENT examination is anicteric.  The neck was supple.  Lungs were clear.  The heart had a regular rate and rhythm without heaves, thrills, murmurs or gallops.  The abdomen was soft, mild tenderness to palpation of the superumbilical region without rebound or referred tenderness noted.  There is also infraumbilical discomforts that was appreciated.  The extremities were within normal limits.  PLAN:  I am going to proceed with the endoscopic examination.  INFORMED CONSENT:  The patient was advised of the procedure, indications, and the risks involved.  The patient has agreed to have the procedure performed at this time.  A video was reviewed and the consent form was obtained.  PREOPERATIVE PREPARATION:  The patient was brought to the endoscopy unit where an IV for IV sedating medication was started.  A  monitor was placed on the patient to monitor the patients vital signs and oxygen saturation.  Nasal oxygen at 2 L per minute was used and after adequate sedation was performed, the procedure was begun.  DESCRIPTION OF PROCEDURE:  The instrument was advanced with the patient in the left lateral position via the direct technique without difficulty.  The oropharyngeal epliglottis, vocal cords and piriform sinuses appeared to be grossly within normal limits.  The esophagus showed evidence of distal esophagitis that was present at this time.  There appeared to be grade 1 to grade 2 characteristic that was also noted.  There appeared to be also a relaxed lower esophageal sphincter versus a hiatal hernia that was noted at this time.  There was no evidence of any ulcerations in the proximal portion of the esophagus that was noted nor any evidence of any varices noted.  The gastric area showed evidence of a normal mucous lake that was appreciated. There was evidence of gastritis changes in the body but more prominent in the antral area that was noted.  Photographs were taken of the region at this time.  The pylorus appeared to be normal with decreased peristaltic activity that was noted.  Upon advancing to the pyloric orifice, the duodenal bulb and second portion appeared to be within normal limits.  The instrument was retracted back where a retroflex view of the cardia showed evidence  of relaxed lower esophageal sphincter but also consistent with a hiatal hernia that was noted.  Funduscopic region appeared to be within normal limits.  The instrument was retracted back where the Z-line appeared to be approximately 34 to 35 cm distal esophagus that was noted.  The instrument was retracted back in the area with no evidence of any postendoscopic trauma noted.  The instrument was subsequently removed per orum without difficulty.  The patient tolerated the procedure well.  TREATMENT: 1. I recommend  treating the patient with a proton pump inhibitor at this time.    This should help the area to improve at this time. 2. Would recommend repeating the procedure in approximately two months to    determine the rate of healing for the process. depending on the results to    determine the course of therapy. DD:  01/23/00 TD:  01/24/00 Job: [id number]

## 2010-11-17 NOTE — Procedures (Signed)
[facility]  Patient:    Brooke [name], Brooke [name]                      MRN: [id number] Proc. Date: 09/05/00 Adm. Date:  [id number] Attending:  Thyra Breed CC:         Sharlot Gowda., M.D.   Procedure Report  PROCEDURE:  Right lumbar sympathetic block.  DIAGNOSIS:  Right knee pain, suspected complex regional pain syndrome.  ANESTHESIOLOGIST:  Thyra Breed, M.D.  INTERVAL HISTORY:  Brooke [name] is a 70 year old who is sent to [place] by Dr. Frederico Hamman for a lumbar sympathetic block for right knee pain. 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  Postoperatively, the patient developed a sharp, stabbing, nagging discomfort over the medial aspect of the knee which, with bending, took on more of a sharp, pulling type sensation.  Whenever she would turn from side to side, the pain would increase.  It has been associated with swelling and increased warmth but no coldness.  She applies ice which hurts initially, but once it is taken off, it feels better.  She feels as though there is some numbness under the knee cap.  She denied any bowel or bladder incontinence.  She has been treated with physical therapy with no overall improvement, and she takes Percocet which she states 2 tablets at a time does not affect the pain at all.  She had an MRI preop of her knee which showed an inferior articular surface tear of the posterior horn of the medial meniscus and chondromalacia patellae.  CURRENT MEDICATIONS:  Premarin, alprazolam, Ambien, Allegra, Percocet, Lipitor, and Nexium.  ALLERGIES:  PENICILLIN.  FAMILY HISTORY:  Positive for diabetes mellitus, coronary artery disease, cancer, osteoarthritis, and elevated cholesterol.  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  She has also undergone polypectomies.  ACTIVE MEDICAL PROBLEMS:  Allergic rhinitis, osteoarthritis, hypercholesterolemia. gastroesophageal reflux disease.  She has been diagnosed as having fibromyalgia by her family practitioner and polyps.  SOCIAL HISTORY:  The patient is a nonsmoker, nondrinker.  She has been on disability for four years due to articular pain.  She moved to [facility] from [place].  REVIEW OF SYSTEMS:  General: Negative.  Head: Significant for migraines which have not been a recent problem.  Eyes: Significant for corrective lens.  Nose, Mouth, Throat: Significant for allergic rhinitis.  Ears: Negative.  Pulmonary: Negative.  Cardiovascular: Significant for some heart fluttering recently for which she sees her primary care physician.  GI: Significant for gastroesophageal reflux disease which is responsive to Nexium.  GU: Negative. Neurologic: No history of seizure or stroke.  See HPI for pertinent positives. Musculoskeletal: See HPI.  Hematologic: Negative.  Cutaneous: Negative. Endocrine: Negative.  Psychiatric: Positive for depression, and that is why she takes Xanax.  Allergy and Immunologic: Seasonal allergies are recurrent problem.  PHYSICAL EXAMINATION:  VITAL SIGNS:  Blood pressure 157/74, heart rate 74, respiratory rate 18, O2 saturation 99%, pain level 9/10, temperature 97.4.  GENERAL:  This is a pleasant, anxious female in no acute distress.  HEENT:  Head normocephalic, atraumatic.  Eyes: Extraocular movements intact with conjunctivae and sclerae clear.  Nose: Patent nares.  Oropharynx  free of lesions.  NECK:  Supple without lymphadenopathy.  Carotids 2+ and symmetric without bruits.  LUNGS:  Clear.  HEART:  Regular rate and rhythm.  BREASTS/ABDOMINAL/PELVIC/RECTAL:  Exams were not performed.  BACK:  No tenderness to  percussion about the vertebrae with negative straight leg raise signs.  EXTREMITIES:  Upper extremities demonstrate evidence of previous surgery of the left elbow.  Radial pulses 2+ and symmetric.  Lower extremities demonstrate 2+ and symmetric dorsalis pedis pulses with tenderness of the right knee and swelling with increased warmth.  She had inability to extend the knee completely with about a 10 degree limitation with flexion to at least 110 degrees with pain along the medial aspect on flexion.  On rotation of the knee, she had a clicking type sensation over the patella to the femur.  She was especially tender along the medial joint space line.  NEUROLOGIC:  The patient was oriented x 4.  Cranial nerves II-XII grossly intact.  Deep tendon reflexes were symmetric in the upper and lower extremities with downgoing toes.  Motor was 5/5, although she had tenderness on resisted extension of the right knee.  Bulk and tone were symmetric. Sensation was intact to pinprick, light touch, and vibratory sense. Coordination was grossly intact.  IMPRESSION: 1. Right knee pain which is different in character to her preoperative pain    which seems sharper.  This is associated with increased warmth. 2. Other medical problems per primary care physician which include anxiety,    depression, allergic rhinitis, osteoarthritis, hypercholesterolemia,    gastroesophageal reflux disease, and questionable history of fibromyalgia.  DISPOSITION:  I discussed the potential risks, benefits, and limitations of a lumbar sympathetic block in detail with the patient as well as with her boyfriend.  Their questions were answered.  PROCEDURE:  After informed consent was obtained, the patient was taken to the thoracoscopy suite where she had an IV established in her right upper extremity and monitors placed.  She was placed in the prone position with pillow under her abdomen.   Using fluoroscopic guidance, I  identified lumbar  vertebrae L1 through L5 and marked the spinous processes.  At 7 cm lateral to the spinous process of L2, a mark was made on the skin.  The skin was was prepped with Betadine x 3.  The skin was draped out.  The patient was sedated with Versed and fentanyl. She received a total of 1 mg of Versed and 50 mcg of fentanyl.  Using a 25-gauge needle, I anesthetized the skin and subcutaneous structures with 3 cc of 1 Lidocaine.  A 20-gauge Chiba needle was introduced to the anterolateral aspect of L3 by AP, lateral, oblique projections. Aspiration was negative for blood and CSF.  I injected 20 ml of 1% chloroprocaine in 1 ml increments with negative aspirates between each increment. The patient noted increased warmth in her foot and decreased foot. Then 20 ml of 0.25% bupivacaine was injected in 1 ml increments with negative aspirates between each increment.  The needle was flushed with 3 cc of air and removed intact.  During the course of the procedure, the patient did note a paresthesia into the anterior aspect of her right thigh.  At that point, the needle was adjusted to avoid any further stimulation of that nerve.  POSTPROCEDURE CONDITION:  The patient noted some discomfort in her right thigh which abated within 15 to 20 minutes to a significant extent.  Her vital signs were stable.  Her pain was markedly  decreased.  The response to the injection suggests that she does have a sympathetic component to her knee pain.  DISCHARGE INSTRUCTIONS: 1. Resume previous diet. 2. Limitation of activities per instruction sheet as outlined by my    assistant today. 3. Continue on current medications. 4. Follow up with me next week for repeat injection. DD:  09/05/00 TD:  09/06/00 Job: [id number]

## 2010-11-17 NOTE — Consult Note (Signed)
Brooke [name], Brooke [name]               ACCOUNT NO.:  [id number]   MEDICAL RECORD NO.:  [id number]          PATIENT TYPE:  INP   LOCATION:  3106                         FACILITY:  [facility]   PHYSICIAN:  Lonia Blood, M.D.DATE OF BIRTH:  [birth year]   DATE OF CONSULTATION:  04/17/2006  DATE OF DISCHARGE:                                   CONSULTATION   PHYSICIAN REQUESTING CONSULTATION:  Dr. Barnett Abu.   PRIMARY CARE PHYSICIAN:  Dr. Kellie Shropshire.   REASON FOR CONSULTATION:  A fever of unknown origin.   HISTORY OF PRESENT ILLNESS:  Ms. Brooke [name] is a 70 year old female who  is followed in the outpatient setting by Dr. Kellie Shropshire.  She has a  longstanding history of a complex diffuse pain syndrome.  She is found to be  suffering with significant spondylotic changes at the L3-L4 and L4-L5  levels.  She was admitted to the hospital by Dr. Barnett Abu on April 15, 2006 and underwent laminectomy to attempt to ameliorate her symptoms.  The  immediate postoperative period has been without complications.  In the last  24 hours, however, she has begun to develop significant nausea.  This has  been accompanied by vomiting on 1 to 2 occasions.  She is unable to tolerate  p.o. intake of pills or nutrition.  She has a low-grade fever at 101.2.  She  complains of crampy lower bilateral quadrant abdominal pain.  This is crampy  in nature.  It does not radiate.  It is associated with almost constant  nausea.  The patient also reports a generalized headache.  She furthermore  complains of significant pain in the back as would be expected in a patient  who is immediately postop.  Yesterday, she was somewhat sedated but this was  felt to be due concomitant administration of Lyrica, Ativan, Phenergan, and  PCA.  The patient's narcotic PCA has been discontinued.   REVIEW OF SYSTEMS:  Comprehensive review of systems was carried out and is  unremarkable with the exception of multiple positive  elements noted in the  history of present illness above.   PAST MEDICAL HISTORY:  1. Lumbar spondylosis with spondylolisthesis status post laminectomy      April 15, 2006.  2. Left knee osteoarthritis status post lateral meniscectomy and debriding      chondroplasty.  3. Status post right ring finger trigger finger release.  4. Hiatal hernia diagnosed by EGD.  5. Status post carpal tunnel release of the left wrist treated in October      2005 and on the right treated in June 2005.  6. Rotator cuff tear status post correction of bilateral shoulders.  7. Nephrolithiasis status post lithotripsy in 2003.  8. Complex diffuse pain syndrome.  9. Hypercholesterolemia.  10.Status post hysterectomy.   OUTPATIENT MEDICATIONS:  1. Xanax 0.5 mg daily.  2. Zolpidem 10 mg nightly.  3. Lyrica 25 mg b.i.d.  4. Phenergan 25 mg p.o. p.r.n.  5. Oxycodone 10 mg b.i.d. p.r.n.  6. Lipitor 40 mg daily.  7. Zegerid 40 mg p.o. b.i.d.  8. Sucralfate 1  g p.o. b.i.d.   ALLERGIES:  No known drug allergies.   FAMILY HISTORY:  The patient's mother died at the age of 36 with  Alzheimer's, diabetes, and hypertension. Family history otherwise reviewed  and is unremarkable.   SOCIAL HISTORY:  The patient does not smoke nor has she ever.  She does not  drink nor has she ever.  She lives in [place].  She is not married but  has an ongoing boyfriend.   DATE REVIEW:  Temperature 101.2, blood pressure 115/35, heart rate 95 to  125, respiratory rate 17, O2 sats 99% on 2 L per nasal cannula.  I and O's  are positive for 1126 cc over the last 24 hours.  A hemoglobin is low at 8.9  with a preop hemoglobin of 12.8.  White count is elevated at 17,000 with  preop white count of 5.7.  Platelet count is 206.  MCV is 96.  Sodium,  potassium, chloride, bicarb, BUN, and creatinine are normal.  Calcium is 7.5  Serum glucose is elevated at 186.   PHYSICAL EXAM:  VITALS:  As noted above.  GENERAL:  Well-developed,  well-nourished female who is somewhat lethargic at  present and appears to be in pain and holding her lower abdomen but is in no  acute respiratory distress.  HEENT:  Normocephalic, atraumatic.  Pupils equal round and reactive to light  and accommodation.  Extraocular muscles are intact bilaterally.  OC/OP  clear.  NECK:  No JVD.  No lymphadenopathy.  LUNGS:  Clear to auscultation bilaterally without wheezes or rhonchi with  the exception of mild bibasilar crackles.  CARDIOVASCULAR:  Sinus tachycardia at present at approximately 105 beats per  minute without murmur, gallop, or rub with regular rhythm.  ABDOMEN:  Mildly overweight, soft.  Bowel sounds are present.  The patient  is tender to deep palpation in the bilateral lower quadrants and much more  so in the suprapubic region.  There is no rebound.  There is no ascites.  The patient is not distended.  EXTREMITIES:  No significant cyanosis, clubbing, edema to the bilateral  lower extremities.  NEUROLOGIC:  The patient is lethargic but alert.  She is oriented to person,  place, and time.  She moves all 4 extremities spontaneously.  Cranial nerves  II through XII are intact bilaterally.   RECOMMENDATIONS:  1. Fever of unknown origin - the patient's fever of unknown origin is      likely related to a pyelonephritis.  She has an elevated white blood      cell count and an elevated fever.  She has a history of nephrolithiasis      and urinary tract instrumentation.  This would put her at an increased      risk for recurrent UTIs.  Given her abdominal discomfort and the      absence of clinical signs to suggest an ileus, I feel that      pyelonephritis is the most likely etiology.  I will send urine for      urinalysis and culture.  I will send blood cultures as well.  I have      not inspected the surgical wound but have spoken with the attending on     the case who reports that he did inspect the wound this morning and      that there  were no signs of infection there.  Bacteremia is also a      consideration in any patient who  has recently undergone surgery.  I      will place the patient on empiric Rocephin as I do feel it is likely an      urinary tract infection.  If urinalysis is unremarkable, we will need      to consider other options.  Chest x-ray will be followed up in the      morning.  KUB will also be obtained.  I am not very concerned about an      ileus, however, as clinical exam is not consistent with such.  2. Hyperglycemia - the patient has a significant elevated serum glucose.      She has no history of diabetes.  She has been dosed with Decadron      during her hospital stay due to her chronic back trouble and      predictable perioperative swelling.  We will place the patient on a      sliding scale insulin to follow her closely due to her hospital stay to      attempt to prevent any further complications that could be associated      with hyperglycemia in this setting.  3. Hiatal hernia.  The patient has a known significant hiatal hernia.  She      is on Zegerid as well as Carafate at home.  I will hold the patient's      p.o. medicines at present as she is not tolerating them well.  I will,      however, place the patient on maximum dose of proton-pump inhibitor via      the IV route.  We will follow her symptoms.  4. Acute blood loss anemia - the patient's baseline hemoglobin is 12.8 as      obtained prior to her surgery.  Her hemoglobin is presently 8.9.  In      this patient, we would likely maintain a hemoglobin of greater than      8.0.  We will follow her CBC in the morning and if she drops below 8,      we will consider transfusion.  At the present time, there is no      indication for emergent transfusion.   Thank you very much for your consultation on this pleasant lady.  We are  happy to continue to follow along with you.      Lonia Blood, M.D.  Electronically Signed      JTM/MEDQ  D:  04/17/2006  T:  04/17/2006  Job:  [id number]   cc:   Merlene Laughter. Renae Gloss, M.D.

## 2010-11-17 NOTE — Procedures (Signed)
[place]  Patient:    Brooke [name], Brooke [name] Visit Number: [id number] MRN: [id number]          Service Type: Attending:  Sharyn Dross., M.D. Dictated by:   Sharyn Dross., M.D. Proc. Date: 11/21/01                             Procedure Report  PROCEDURE PERFORMED:  ENDOSCOPIST:  Sharyn Dross., M.D.  INDICATIONS FOR PROCEDURE:  This 70 year old female presented with dysphagia that has been noted over the past few weeks.  She has had previous exams for similar problems in the past and was found to have gastroduodenitis which was treated conservatively at this time.  She has not been seen for the past several months but had recurrent complaints of having difficulty swallowing that was present.  She also complained of heartburn and discomforts that was noted.  She denies any history of any hematemesis or melanotic stools that was present.  OBJECTIVE FINDINGS:  The patient is a very pleasant female who appears to be in no acute distress.  Her vital signs are stable.  Her HEENT examination is anicteric.  The neck was supple.  The lungs were clear to auscultation and percussion.  The heart had a regular rate and rhythm without heaves, thrills, murmurs or gallops.  The abdomen was soft.  There was mild tenderness to palpation.  No asymmetry appreciated.  Bowel sounds are present.  The extremities showed no clubbing, cyanosis or edema.  GROSS IMPRESSION: 1. Dysphagia. 2. Heartburn. 3. Rule out possibility of gastroesophageal reflux disease. 4. Rule out Barretts esophagus. 5. Rule out motility discomforts that is present at this time.  PRESENT RECOMMENDATIONS:  I am going to proceed with the endoscopic examination today and depending on the results to determine the course of therapy.  INFORMED CONSENT:  The patient was advised of the procedure, indications, and the risks involved.  The patient has agreed to have the procedure performed  at this time.  PREOPERATIVE PREPARATION:  The patient was brought to the [facility] endoscopy unit where an IV for IV sedating medication was started.  A monitor was placed on the patient to monitor the patients vital signs and oxygen saturations. Oxygen via nasal cannula was used at 2L per minute and after adequate sedation was performed, the procedure was begun.  DESCRIPTION OF PROCEDURE:  The instrument was advanced with the patient in the left lateral position via the direct technique without difficulty.  The oropharyngeal epiglottis, vocal cords and piriform sinuses appeared to be grossly within normal limits.  The esophagus was normal without any evidence of acute inflammation, ulcerations, hiatal hernias or varices appreciated.  The gastric area showed a normal mucous lake without any evidence of acute inflammation or ulcerations that was noted at this time.  The antral area appeared to be unremarkable with specks of inflammation in the antral area that was noted at this time.  Photographs were taken.  The pylorus was normal with good peristaltic activity.  Upon advancement through the pyloric canal, the duodenal bulb and second portion appeared to be within normal limits.  As the instrument was retracted back the biopsies for the CLO study was noted and again mild inflammation was noted at this time.  Retroflex view of the cardia revealed evidence of specks of heme that was present from this region that was noted.  There was no evidence of any hiatal  hernia or relaxed lower esophageal sphincter appreciated.  The Z-line however, appeared to be approximately 36 cm distal esophagus that was noted.  There was also noted as the instrument was retracted back, of grade A or 1 evidence of distal esophagitis that was noted. No evidence of any acute heme that was present.  The rest of the esophagus again appeared to be unremarkable as the instrument was removed per orum.  The patient  tolerated the procedure well.  TREATMENT: 1. I am going to start the patient with a proton pump inhibitors at this time. 2. Will await the results of the CLO study and depending on the results will  determine the course of therapy.  She is to follow up with me in two weeks. Dictated by:   Sharyn Dross., M.D. Attending:  Dortha Kern, Montez Hageman., M.D. DD:  11/21/01 TD:  11/24/01 Job: [id number]

## 2010-11-17 NOTE — Op Note (Signed)
NAME:  Brooke [name], Brooke [name]                   ACCOUNT NO.:  [id number]   MEDICAL RECORD NO.:  [id number]          PATIENT TYPE:  AMB   LOCATION:  DSC                          FACILITY:  [facility]   PHYSICIAN:  Thera Flake., M.D.DATE OF BIRTH:  05-29-1941   DATE OF PROCEDURE:  04/05/2004  DATE OF DISCHARGE:                                 OPERATIVE REPORT   PREOPERATIVE DIAGNOSES:  1.  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  2.  For the shoulder, complete rotator cuff tear with degenerative tear and      anterosuperior labrum impingement, AC joint arthritis.   POSTOPERATIVE DIAGNOSES:  1.  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  2.  For the shoulder, complete rotator cuff tear with degenerative tear and      anterosuperior labrum impingement, acromioclavicular joint arthritis.   PROCEDURE:  Hand:  1.  Carpal tunnel release, hand.  2.  Synovectomy.   Shoulder:  1.  Open rotator cuff repair, acromioplasty.  2.  Arthroscopic debridement, torn labrum.   SURGEON:  Dyke Brackett, M.D.   ASSISTANT:  Jamelle Rushing, P.A.   ANESTHESIA:  General/block.   INDICATIONS FOR PROCEDURE:  This 70 year old female with MRI proven cuff  tear with relatively severe carpal tunnel, thought to be amenable to  overnight hospitalization for treatment of each.   DESCRIPTION OF PROCEDURE:  She was examined under anesthesia, normal range  of the motion, no instability.   She was initially approached with a carpal tunnel incision, curvilinear  incision just ulnar to the nerve crease. Dissection was carried through  subcutaneous tissues, through the transverse carpal ligament. Dissection  carried out on the ulnar side of the ligament to the level of the  superficial arch proximally, to include the antebrachial fascia to the  wrist.  Nerve was moderately constricted with mild synovitis which was  debrided.  Copious irrigation with closure with interrupted nylon.  Lightly,  compressive  sterile dressing applied.   Sterile prep and drape in the beach chair position for the shoulder.  Arthroscopic evaluation through the posterior and lateral portals revealed  probably a 5 cm cuff tear. There was degenerative tearing along the  anterosuperior labrum, rather extensive tearing actually including the  superior labrum.  This was aggressively debrided.  Some involvement of the  biceps tendon was noted, no instability was noted.  She was again, noting a  relatively large tear, approached with an open procedure with a small  incision over the deltoid acromial interface with dissection of about 4-5 cm  of incision, revealing a moderate cuff tear, relative to the size mentioned  above.  Good, fresh edges were obtained.  Burring of the bony surfaces of  acromioplasty was carried out, resecting about 7-8 mm of anterior edge of  the acromion, relieving the impingement nicely. The [facility] joint was not  significantly involved.   Two Arthrex 5.5 mm anchors were placed, creating essentially a watertight  repair, total of 4 sutures with fiber wire, followed by oversewing the  edges.  This was followed by meticulous repair  of the deltoid skin, as well  as subcutaneous tissues with 2-0 Vicryl and skin with Monocryl.  Lightly,  compressive sterile dressing and sling applied.  Taken to the recovery room  in stable condition.      Margarito Liner   WDC/MEDQ  D:  04/05/2004  T:  04/05/2004  Job:  [id number]

## 2010-11-17 NOTE — Op Note (Signed)
NAMENATHIFA, RITTHALER               ACCOUNT NO.:  [id number]   MEDICAL RECORD NO.:  [id number]          PATIENT TYPE:  AMB   LOCATION:  DSC                          FACILITY:  [facility]   PHYSICIAN:  Dyke Brackett, M.D.    DATE OF BIRTH:  04-Nov-1940   DATE OF PROCEDURE:  02/02/2005  DATE OF DISCHARGE:                                 OPERATIVE REPORT   PREOPERATIVE DIAGNOSES:  1.  Left knee osteoarthritis, medial compartment.  2.  Discoid meniscus.  3.  Right ring trigger finger.   POSTOPERATIVE DIAGNOSES:  1.  Left knee osteoarthritis, medial compartment.  2.  Discoid meniscus.  3.  Right ring trigger finger.   OPERATION PERFORMED:  1.  Partial lateral meniscectomy.  2.  Debridement chondroplasty.  3.  Release of the annular pulley, right ring finger.   SURGEON:  Dyke Brackett, M.D.   TOURNIQUET TIME:  15 minutes on the hand.   ANESTHESIA:  General.   INDICATIONS FOR PROCEDURE:  Patient with intractable left knee pain and a  right ring trigger finger thought to be amenable to outpatient surgery.   DESCRIPTION OF PROCEDURE:  He was arthroscoped in the supine position  through the inferomedial and inferolateral portal.  There was moderate  degenerative change along the medial compartment with rough eburnated bone  on the posterior one half of the tibial plateau.  No meniscal tear was  present medially.  This was debrided.  ACL, PCL were normal. The lateral  meniscus showed a complete discoid meniscus with some tearing centrally.  It  was debrided back to a good stable rim to basically what appeared to be a  more normal meniscus, so again, partial medial meniscectomy was carried out  of the discoid meniscus.  These were the two main pathologies encountered.  Again, the medial compartment, debrided separate from the lateral  meniscectomy.  Knee drained free of fluid, portals closed with nylon, knee  infiltrated with Marcaine, morphine and 4 mg of Depo-Medrol.   Sterile prep and  drape of the hand.  Incision was made just on the distal  wrist crease in line with the ring finger.  Complete release of the annular  pulley carried out, relieving the constriction on the tendon.  Careful  protection of  the neurovascular structures carried out. Closure with 4-0 nylon,  infiltration of the skin with 5 mL of 0.5% Marcaine. Lightly compressive  sterile dressing applied to both areas, that is, right hand and left knee,  taken to recovery room in stable condition.      Dyke Brackett, M.D.  Electronically Signed     WDC/MEDQ  D:  02/02/2005  T:  02/03/2005  Job:  [id number]

## 2010-11-17 NOTE — Op Note (Signed)
[facility]  Patient:    Brooke [name], Brooke [name] Visit Number: [id number] MRN: [id number]          Service Type: NES Location: NESC Attending Physician:  Evlyn Clines Dictated by:   Excell Seltzer. Annabell Howells, M.D. Proc. Date: 08/28/01 Admit Date:  08/28/2001   CC:         Kristian Covey, M.D.   Operative Report  PROCEDURE:  Cystoscopy, insertion of right double J stent.  PREOPERATIVE DIAGNOSIS:  Right UPJ stone with pain.  POSTOPERATIVE DIAGNOSIS:  Right UPJ stone with pain.  SURGEON:  Excell Seltzer. Annabell Howells, M.D.  ANESTHESIA:  MAC.  COMPLICATIONS:  None.  INDICATIONS FOR PROCEDURE:  [name] is a 70 year old female with a large right UPJ stone who was to undergo lithotripsy today. She was having pain int the right flank prior to the procedure and was given the preop but the pain persisted. She was taken to the truck where additional morphine and Versed was given; however, we were never able to get her comfortable. She was crying in pain and I felt after 341 shocks at low power that we were not going to be able to effectively treat her stone and I did not feel as though it would be fair to the patient to try to treat her in such pain. At this point, we aborted the procedure and took her back to the [facility] where it was felt that placement of a ureteral stent was indicated prior to return to the lithotripsy truck. I was unable to obtain a signature on the permit because she was medicated and her companion was not a family member; however, I did explain the risk of the procedure to him and felt in this situation it merited that we proceeded.  FINDINGS AND PROCEDURE:  She was taken to the operating room where she was placed on the table in supine position. Additional sedation was given and she was placed in lithotomy position. Her perineum and genitalia were prepped with Betadine solution and she was draped in the usual sterile fashion.  Her urethra was instilled with 2% lidocaine jelly and a 22 French cystoscope sheath was inserted. The bladder was examined with the 12 and 70 degree lenses, no significant abnormalities were noted. The bladder wall and ureteral orifices were in the normal anatomic position effluxing clear urine. After thorough cystoscopic insertion, a guidewire was passed up the right ureteral orifice bypassing the UPJ stone into the kidney. A 6 French 24 cm double J stent was then passed over the wire without difficulty to the kidney. The wire was removed leaving a good coil in the kidney and a good coil in the bladder. At this point, the bladder was drained, the cystoscope was removed leaving the stent string exiting the urethra. The string was tied closed to the meatus and cut short. The patient was taken down from lithotomy position and placed back on a stretcher. She was then moved back to the lithotripsy truck where the lithotripsy procedure was completed. This will be included in a separate operative note. There were no complications during this portion of the procedure. Dictated by:   Excell Seltzer. Annabell Howells, M.D. Attending Physician:  Evlyn Clines DD:  08/28/01 TD:  08/28/01 Job: [id number]

## 2010-11-17 NOTE — Op Note (Signed)
NAME:  Brooke [name], Brooke [name]                         ACCOUNT NO.:  [id number]   MEDICAL RECORD NO.:  [id number]                   PATIENT TYPE:  AMB   LOCATION:  DSC                                  FACILITY:  [facility]   PHYSICIAN:  Thera Flake., M.D.             DATE OF BIRTH:  Mar 03, 1941   DATE OF PROCEDURE:  12/22/2003  DATE OF DISCHARGE:                                 OPERATIVE REPORT   INDICATIONS FOR PROCEDURE:  The patient is a 70 year old female who had MRI  proven rotator cuff tear with a moderate involvement of the same hand with a  carpal tunnel on the ipsilateral side thought to be amenable to overnight  hospitalization and release of both.   PREOPERATIVE DIAGNOSIS:  1. Complete rotator cuff tear with involvement of the supraspinatus and     infraspinatus.  2. Degenerative tear in anterior and superior labrum.  3. Impingement.  4. Carpal tunnel.  5. Synovitis, right wrist.   POSTOPERATIVE DIAGNOSIS:  1. Complete rotator cuff tear with involvement of the supraspinatus and     infraspinatus.  2. Degenerative tear in anterior and superior labrum.  3. Impingement.  4. Carpal tunnel.  5. Synovitis, right wrist.   OPERATION:  1. For the shoulder, arthroscopic acromioplasty rotator cuff repair,     chronic.  2. Arthroscopic debridement of torn labrum.  3. For the wrist, carpal tunnel release.  4. Synovectomy, wrist.   SURGEON:  Dyke Brackett, M.D.   ASSISTANT:  Madilyn Fireman, P.A.-C.   ANESTHESIA:  General/block.   DESCRIPTION OF PROCEDURE:  Initially, sterile prep and drape, the patient  was supine with exsanguination of the forearm tourniquet to 250.  A  curvilinear incision was made just ulnar to the thenar crease.  Dissection  was carried down to the level of the transverse carpal ligament through the  palmar fascia.  Dissection was carried out distally to the level of the  superficial arch, proximally to the antebrachial fascia.  Moderate  compression of the  nerve with nonspecific synovitis which was debrided.  The  wound was irrigated and closed with interrupted 4-0 nylon.  Marcaine without  epinephrine infiltrated into the wound.  A sterile dressing was applied.   Reprep and drape in the beach chair position for the shoulder.  Arthroscopic  portals were created anterior, lateral, and posteriorly.  Moderately severe  cuff tear with complete tear with some involvement in the infraspinatus  noted, relatively severe tearing of the anterior superior labrum which was  not amenable to repair which was debrided aggressively.  The biceps tendon  was intact although there was a labral tear.  Next, through a small skin  incision of the deltoid about 1 cm distal to the tip, mobilization and  freshening of the cuff of tissue which was moderately attenuated.  There was  a small separate tear of the rotator cuff interval in addition to the  actual  cuff tear which was sewn with an over and over suture of #1 FiberWire  followed by repair with two Arthrex anchors for a total of four sutures and  over sewing the edge with a #1 FiberWire, as well.  This created an  essentially watertight repair despite rather severe involvement of the  tendon under no undue tension.  Copious irrigation was carried out.  Closure  of the deltoid with #1 Tycron, 2-0 Vicryl in the skin with Monocryl.  Marcaine without epinephrine of the skin.  A lightly compressive sterile  dressing was applied and a sling.  The hand was redressed sterilely.                                               Thera Flake., M.D.    WDC/MEDQ  D:  12/22/2003  T:  12/23/2003  Job:  [id number]

## 2010-11-17 NOTE — Op Note (Signed)
[facility]  Patient:    Brooke [name], Brooke [name]                      MRN: [id number] Proc. Date: 09/13/00 Adm. Date:  [id number] Attending:  Thyra Breed CC:         Sharlot Gowda., M.D.   Operative Report  PREOPERATIVE DIAGNOSIS: Complex regional pain syndrome with right knee pain.  POSTOPERATIVE DIAGNOSIS:  Complex regional pain syndrome with right knee pain.  PROCEDURE:  Right lumbar sympathetic block.  SURGEON:  Thyra Breed, M.D.  INTERVAL HISTORY:  The patient noted about three to four days worth of benefits from the injection, but it has slowly recurred.  The pain is predominantly over the medial aspect of the right knee.  It is a sharp, pulling type sensation.  MEDICATIONS:  Unchanged.  PHYSICAL EXAMINATION:  Blood pressure 140/76, heart rate 89, respiratory rate 20, O2 saturation 99%.  Pain level 8/10.  She exhibits tenderness over the right knee along the medial aspect.  There is no temperature differences.  DESCRIPTION OF PROCEDURE:  After informed consent was obtained, the patient was placed in the prone position and IV established.  A pillow was placed under her abdomen.  She was sedated with versed and fentanyl.  Using fluoroscopic guidance, I identified lumbar vertebrae L1 through L5 and marked the spinous processes.  Next, 7 cm lateral to the spinous process of L2 a mark was made on the skin.  The skin was prepped with Betadine x 3 and draped. Using a 25 gauge short needle, I anesthetized the skin and subcutaneous structures with 2 cc of 1% lidocaine.  A 25 gauge spinal needle was used to anesthetize deeper structures with an additional 2 cc of 1% lidocaine.  A 22 gauge Chiba needle was introduced through the anterolateral aspect of L3 by AP, lateral and oblique projections.  Aspiration was negative for blood and CSF.  Next 20 ml of 1% chloroprocaine was injected in 1 ml increments with negative aspirates between each  increment.  The patient noted that her foot was warming and her pain was decreased.  I injected 20 ml of 0.25% bupivacaine in 1 ml increments with negative aspirate between each increment.  The needle was flushed with 3 cc of air and removed intact.  Post-procedure Condition:  The patient noted attenuation in her pain but not total resolution.  She had increased warmth of her right foot with vasodilation.  Post-procedure condition is stable.  DISCHARGE INSTRUCTIONS:  1. Resume previous diet.  2. Limitation of activities per instruction sheet as outlined by my assistant today.  3. Continue on current medications.  4. Follow up with me in seven to 10 days for repeat injection if she has a positive response.  If she is not having a positive response, I do not see the point in further injections at this time. DD:  09/13/00 TD:  09/15/00 Job: [id number]

## 2010-11-17 NOTE — Op Note (Signed)
NAMETAIS, KOESTNER               ACCOUNT NO.:  [id number]   MEDICAL RECORD NO.:  [id number]          PATIENT TYPE:  INP   LOCATION:  3172                         FACILITY:  [facility]   PHYSICIAN:  Stefani Dama, M.D.  DATE OF BIRTH:  Brooke [name], Brooke [name]   DATE OF PROCEDURE:  04/15/2006  DATE OF DISCHARGE:                                 OPERATIVE REPORT   PREOPERATIVE DIAGNOSIS:  Lumbar spondylosis and spondylolisthesis stenosis  L2-L3, L3-L4, and L4-L5, lumbar radiculopathy.   POSTOPERATIVE DIAGNOSIS:  Lumbar spondylosis and spondylolisthesis stenosis  L2-L3, L3-L4, and L4-L5, lumbar radiculopathy.   PROCEDURE:  Laminectomy L2, L3, and L4, posterior lumbar interbody  arthrodesis with PEEK bone spacer local autograft and allograft L2-L3, L3-  L4, L4-K5, segmental fixation from L2 to L5 with pedicle screws, and  posterolateral arthrodesis with local autograft and allograft L2 to L5.   SURGEON:  Stefani Dama, M.D.   FIRST ASSISTANT:  Clydene Fake, M.D.   ANESTHESIA:  General endotracheal.   INDICATIONS:  Brooke [name] is a 70 year old individual who has had  significant back pain for a number of years.  She notes that the situation  has been getting progressively worse.  Six years ago, she was evaluated by  Dr. Trey Sailors who suggested conservative management, but since that time, she  has had marked progression of spondylitic changes now with a  spondylolisthesis at the L4-L5 level and marked disc bulges and central  canal stenosis secondary to a combination of posterior hypertrophy of the  facet joints along with disc space changes at L2-L3, L3-L4, and L4-L5.  She  is taken to the operating room to undergo surgical decompression.   DESCRIPTION OF PROCEDURE:  The patient was brought to the operating room  supine on the stretcher. After the smooth induction of general endotracheal  anesthesia, she was turned prone.  The back was prepped with DuraPrep and  draped in a sterile  fashion.  A midline incision was created and carried  down to the lumbodorsal fascia.  The fascia was opened on either side of the  midline to expose the spinous processes of what was identified as L2, L3,  and L4.  The interlaminar spaces were then dissected in a subperiosteal  fashion and the lateral gutters were then packed off with RGS gauze sponges.  The laminectomy was then started by removing the spinous processes and  laminar arches of L3 and L4.  L2 was significantly undercut.  Laminotomies  were then created removing a large portion of the inferior margin of the  lamina of L2 including the facet joint at the L2-L3 articulation.  This  being removed, the common dural tube was exposed. The disc space at the L2-  L3 space was exposed and was noted be bulging posteriorly very  significantly. The L3-L4 disc space was then exposed in a similar fashion,  again, with complete laminectomy being performed removing the entirety of  the facet joint complex. At L4-L5, a similar process was carried out.  The  spondylolisthesis was noted at the L4-L5 level. The disc space was then  entered, all from the patient's right side and right-sided laminotomy and  discectomies were performed at L2-L3, L3-L4, and L4-L5.  Each segment was  then sized for appropriate sized PLIF spacer after cleaning and it was felt  that a 10-mm spacer would fit at L2-L3, 11 mm at L3-L4, and a 12 mm at L4-  L5. These were packed with autologous bone and a combination of allograft  and Osteocel bone matrix.  The Osteocel bone matrix was mixed with 10 mL of  alpha grain tricalcium phosphate matrix and this was also mixed with an  aspirate of the patient's own bone obtained from pedicle entry probe placed  at the L4 vertebra. This was from the right side. With this then, the bone  matrix was packed into the disc space. Starting at the L2-L3 space, a 10 mm  PEEK spacer was placed at that level. At L3-L4, similar procedure was   carried out after complete discectomy from the right side was performed  along with curettage of the endplates. The 11 mm cage was then placed after  packing the bone into the interspace.  Then, at the L4-L5 space, a similar  condition was met with the interbody graft being placed from the right side  after complete discectomy from the right and placing a 12 mm bone spacer  into the L4-L5 space. With this then, hemostasis in this area was obtained  by packing the area with some pledgets of Gelfoam soaked in thrombin and  cottonoid patties.  The pedicle entry sites were then chosen by bringing in  the fluoroscopy, checking a singular AP view, and then noting pedicle entry  sites in the lateral views and placing pedicle probes into the pedicles at  the L2, L3, L4, and L5 vertebra, first on the right and then on the left.  With the pedicle probes being placed, the probes were removed and tapped to  a 6.5 mm diameter and 6.5 x 40 mm screws were used in L2, L3, and L4, and  sacral 6.5 x 40 mm screws were used in L5. Then, 70-mm rods were placed  inbetween the screw heads. This was then torqued down into the final locking  position in a neutral position.  Localizing radiographs were obtained  showing the hardware. The nerve roots were then sounded from L2 down to L5  and they were found to be free and clear as we traveled out the foramen.  It  should be noted that each of the pedicle entry sites was then sounded to  make sure there was no evidence of cutout, none was noted.  The final  localizing radiographs identified good position of the instrumentation, good  position of the interbody grafts.  Posterolateral graft was then packed out  over the lateral gutters around the area of the hardware from L2 to L5. With  this hemostasis was checked in the soft tissues and the lumbodorsal fascia was then closed with #1 Vicryl in an interrupted fashion, 2-0 Vicryl used in  subcutaneous tissues, 3-0 Vicryl  subcuticularly, and Dermabond was placed on  the skin.  The patient tolerated procedure well.  She was then returned to  the ICU in stable condition.      Stefani Dama, M.D.  Electronically Signed     HJE/MEDQ  D:  04/15/2006  T:  04/16/2006  Job:  [id number]

## 2010-11-17 NOTE — Op Note (Signed)
   NAME:  Brooke [name], Brooke [name]                         ACCOUNT NO.:  [id number]   MEDICAL RECORD NO.:  [id number]                   PATIENT TYPE:  AMB   LOCATION:  NESC                                 FACILITY:  [facility]   PHYSICIAN:  Maretta Bees. Vonita Moss, M.D.             DATE OF BIRTH:  07/28/1940   DATE OF PROCEDURE:  06/15/2002  DATE OF DISCHARGE:                                 OPERATIVE REPORT   INDICATIONS FOR PROCEDURE:  This 70 year old lady has had a past history of  stones and required a lithotripsy in February 2003.  For the last few weeks,  she has been complaining of right flank pain.  X-rays in the office showed a  distal right ureteral calculus, measuring about 3 mm in size.  She requested  pain relief and is brought to the OR today for ureteroscopic therapy and  evaluation.   PROCEDURE:  The patient was brought to the operating room and placed in the  lithotomy position.  The external genitalia were prepped and draped in the  usual fashion.  The urethra required dilation, and a 25-French cystoscope  was inserted then without difficulty.  There was a tiny stone in the bladder  that washed out.  I then inserted a guide wire up the right ureter without  difficulty.  I then inserted the 6 French ureteroscope adjacent to the guide  wire without difficulty and was able to visualize her right ureter in the  right renal pelvis.  No stones, tumors, or any other lesions were seen.  I  then performed a right retrograde pyelogram that showed double calyces in  the right kidney and good peristalsis and drainage of the ureter.  I decided  to leave the double J catheter out because I recall her telling me that she  did not tolerate it well and having undergone essentially atraumatic  ureteroscopy.  No stones were seen in the ureter at this time.  I decided to  leave in the double J at this time.  At this point, the bladder was empty,  the scope removed, and the patient sent to the recovery room  in good  condition.                                               Maretta Bees. Vonita Moss, M.D.    LJP/MEDQ  D:  06/15/2002  T:  06/15/2002  Job:  [id number]

## 2010-11-17 NOTE — Discharge Summary (Signed)
Brooke [name], Brooke [name]               ACCOUNT NO.:  [id number]   MEDICAL RECORD NO.:  [id number]          PATIENT TYPE:  INP   LOCATION:  3035                         FACILITY:  [facility]   PHYSICIAN:  Stefani Dama, M.D.  DATE OF BIRTH:  [birth year]   DATE OF ADMISSION:  04/15/2006  DATE OF DISCHARGE:  04/23/2006                                 DISCHARGE SUMMARY   ADMISSION DIAGNOSIS:  Lumbar spondylosis with spondylolisthesis and stenosis  L2-3, L3-4, and L4-5.   DISCHARGE AND FINAL DIAGNOSIS:  Lumbar spondylosis with spondylolisthesis  and stenosis L2-3, L3-4, and L4-5.   OPERATION:  Lumbar laminectomy L2, L3. L4.  Posterior interbody arthrodesis  with Peek bone spacers, local autograft and allograft L2-3, L3-4, L4-5,  segmental fixation from L2 to L5 with pedicle screws, posterolateral  arthrodesis with local autograft and allograft L2 to L5.   ADDITIONAL DIAGNOSES:  Include:  1. Fever of unknown origin.  2. Chronic pain.  3. Hyperglycemia.  4. Acute blood loss anemia.   CONDITION ON DISCHARGE:  Improving.   HOSPITAL COURSE:  Brooke [name] is a 70 year old individual who has had  significant problems with chronic pain in her low back and lower  extremities.  She has severe spondylosis with spondylolisthesis at the L4-L5  level, and after careful consideration of her options, she was advised  regarding surgical decompression arthrodesis from L3 to L5.  This was  performed on the 15th.  During the postoperative period, she required 3  units of blood transfusion as she was, per family, anemic from acute blood  loss anemia.  She responded well to this, however, the patient complained of  substantial chronic pain which seemed to be exacerbated despite maximal  doses of parenteral narcotic analgesics.  Gradually however, her pain  improved and she seemed to respond well to oral Percocet.  She was given  Percocet at the time of discharge and is given a prescription for this  medication #60 without refills.  Because she has had chronic nausea which  has been poorly defined, she has been using Phenergan 25 mg every 4-6 hours  as needed.  She will be discharged on this medication.  Over all, she is  improving and at the time of discharge, she is able to ambulate up to 300  feet at a time, and she will be discharged home today to be seen in 3 week's  time.  Her incision is clean and dry, and neurologically the patient appears  to be intact.      Stefani Dama, M.D.  Electronically Signed     HJE/MEDQ  D:  04/23/2006  T:  04/23/2006  Job:  [id number]

## 2010-11-17 NOTE — Procedures (Signed)
[place]  Patient:    Brooke [name], Brooke [name]                      MRN: [id number] Proc. Date: 03/14/00 Adm. Date:  [id number] Disc. Date: [id number] Attending:  Sharyn Dross                           Procedure Report  PREOPERATIVE DIAGNOSIS:  Dysphagia.  POSTOPERATIVE DIAGNOSES: 1. Normal esophagus at this time. 2. Mild gastritis changes noted.  PROCEDURE:  Esophagogastroduodenoscopy.  MEDICATION:  Demerol 20 mg IV, Versed 8 mg IV, Benadryl 25 mg IV.  INSTRUMENTS:  The Olympus video panendoscope.  ENDOSCOPIST:  Sharyn Dross., M.D.  INDICATIONS:  This pleasant 70 year old female came back to me for an evaluation because of difficulty swallowing.  She had had associated abdominal pains and discomfort noted.  She has previously had peptic disease that was documented in the past, but no previous dysphagia symptoms that were noted. She states that food, solids as well as some liquids get stuck in the area at this time with difficulty with passing by.  She also complains of infraumbilical abdominal pains that were noted.  OBJECTIVE FINDINGS:  GENERAL:  She is a pleasant female in no distress.  VITAL SIGNS:  Stable.  HEENT:  Aicteric.  NECK:  Supple.  LUNGS:  Clear.  HEART:  Regular rate and rhythm without heaves, thrills, murmurs, rubs or gallops.  ABDOMEN:  Soft, mild tenderness to palpation, no rebound or referred tenderness appreciated.  RECTAL:  Digital rectal exam was deferred.  EXTREMITIES:  She had no cyanosis, clubbing or edema.  PLAN:  Proceed with the endoscopic examination.  INFORMED CONSENT:  The patient was advised of the procedure, indication and risks involved.  A video was viewed and consent form obtained.  PREOPERATIVE PREPARATION:  The patient was brought to the endoscopy unit on an IV and IV sedative medication was started.  A monitor was placed on the patient to monitor the patients vital signs and oxygen  saturation.  Nasal oxygen at 2 liters per minute was used and after adequate sedation was performed, the procedure was begun.  PROCEDURE IN DETAIL:  The instrument was advanced with the patient lying in the left lateral position via direct technique without difficulty.  The oropharyngeal, epiglottis, vocal cords, and piriform sinuses appeared to be grossly within normal limits.  The esophagus was normal without any evidence of acute inflammation, ulcerations, hiatal hernias, or varices appreciated.  The gastric area showed a normal mucous lake with evidence of mild gastritis changes that were noted.  The antral area appeared to be unremarkable at this time.  The pylorus was normal with some difficulty in pyloric orifice opening. However, it was showing evidence of relaxation.  With manipulation, the instrument was advanced through the pyloric orifice, and the duodenal bulb and second portion appeared to be within normal limits.  The ampulla of Vater appeared slightly boggy that was noted and must consider a possible stone that may be present in the area.  The instrument was gradually retracted back, it was noted.  Retroflexed view of the cardia revealed no major pathology that was noted.  The Z-line appeared to be approximately at 36 cm from the distal esophagus that was noted.  As the instrument was retracted back into the esophageal area, there appeared to be a ring that was present consistent with a Schatzkis ring that  was noted. However, upon further analysis of this ring, there appeared to be evidence of previous markings consistent with biopsies that were taken.  The ring appeared to show no evidence of an obstructive process that was noted, and thus, no other avenues or ventures were obtained.  The instrument was gradually removed per ora without difficulty, with the patient tolerating the procedure well.  TREATMENT: 1. Conservative management at this time. 2. We will  evaluate the size of the particles that the patient is eating to    see if there is a possibility that Schatzkis ring could be still    inhibiting this process. 3. If the procedure has to be performed again, we will do it at a place where    anesthesia may be necessary. 4. We will have followup in the office during the interim. DD:  03/14/00 TD:  03/15/00 Job: [id number]

## 2010-11-17 NOTE — Op Note (Signed)
Brooke [name], Brooke [name]               ACCOUNT NO.:  [id number]   MEDICAL RECORD NO.:  [id number]          PATIENT TYPE:  AMB   LOCATION:  ENDO                         FACILITY:  [facility]   PHYSICIAN:  [name]. [name], MD    DATE OF BIRTH:  1941/01/03   DATE OF PROCEDURE:  05/11/2004  DATE OF DISCHARGE:                                 OPERATIVE REPORT   PROCEDURE:  Esophagogastroduodenoscopy.   INDICATIONS FOR PROCEDURE:  Dysphagia.   ENDOSCOPIST:  [name]. [name], MD   INSTRUMENT USED:  Adult Olympus endoscope.   CONSENT:  Informed consent was obtained from the patient describing the  risks of bleeding, infection, perforation, medication reactions and the  risks of death all of which are not exclusive of any other complications  that may occur.   PHYSICAL EXAMINATION:  CARDIAC:  Regular rate and rhythm.  LUNGS:  Clear to auscultation bilaterally.  ABDOMEN:  Flat and soft with epigastric tenderness.   MEDICATIONS:  Versed 10 mg and Demerol 100 mg.   DESCRIPTION OF PROCEDURE:  After adequate sedation was achieved, the  endoscope was advanced from the oral cavity to the duodenum under direct  visualization without any difficulty.  Inspection of the duodenum revealed a  normal mucosa and no evidence of any ulcerations or erosions.  Upon  withdrawal of the endoscope into the stomach, the patient is documented to  have multiple small erosions.  Biopsies were obtained randomly in the  stomach to evaluate for Helicobacter pylori.  Retroflexion did reveal a  hiatal hernia and no evidence of any masses at the gastroesophageal  junction.  The endoscope was then straightened and withdrawn into the  esophagus.  The patient is measured to have a 4 cm hiatal hernia extending  from 30 to 34 cm and LA Grade A esophagitis.   PLAN:  1.  Follow-up on biopsies.  2.  Follow-up in the clinic in two weeks.  3.  Maintain a proton pump inhibitor b.i.d.       PDH/MEDQ  D:  05/11/2004  T:  05/11/2004   Job:  [id number]

## 2011-01-09 ENCOUNTER — Ambulatory Visit
Admission: RE | Admit: 2011-01-09 | Discharge: 2011-01-09 | Disposition: A | Discharge status: Home / Self Care | Payer: Medicare Other | Source: Ambulatory Visit | Attending: Family Medicine | Admitting: Family Medicine

## 2011-01-09 ENCOUNTER — Other Ambulatory Visit: Payer: Self-pay | Admitting: Family Medicine

## 2011-01-09 DIAGNOSIS — R05 Cough: Secondary | ICD-10-CM

## 2011-03-22 LAB — CROSSMATCH
ABO/RH(D): O NEG
Antibody Screen: POSITIVE
DAT, IgG: NEGATIVE

## 2011-03-22 LAB — DIFFERENTIAL
Basophils Relative: 1
Eosinophils Relative: 0
Lymphocytes Relative: 39
Monocytes Absolute: 0.4
Monocytes Relative: 8
Neutro Abs: 2.9

## 2011-03-22 LAB — BASIC METABOLIC PANEL
BUN: 9
CO2: 26
CO2: 27
CO2: 27
Calcium: 8.4
Calcium: 8.5
Chloride: 104
Creatinine, Ser: 0.82
Creatinine, Ser: 0.97
GFR calc Af Amer: >60
GFR calc Af Amer: >60
GFR calc non Af Amer: >60
Glucose, Bld: 106 — ABNORMAL HIGH
Sodium: 138

## 2011-03-22 LAB — COMPREHENSIVE METABOLIC PANEL
AST: 31
Albumin: 4.1
Alkaline Phosphatase: 66
BUN: 9
GFR calc Af Amer: >60
Potassium: 3.3 — ABNORMAL LOW
Total Protein: 6.9

## 2011-03-22 LAB — CBC
HCT: 36.4
Hemoglobin: 9.5 — ABNORMAL LOW
MCHC: 33.8
MCHC: 34.1
MCHC: 34.3
MCV: 93.7
Platelets: 249
Platelets: 353
RBC: 2.96 — ABNORMAL LOW
RBC: 2.99 — ABNORMAL LOW
RDW: 12.9
WBC: 5.5

## 2011-03-22 LAB — URINALYSIS, ROUTINE W REFLEX MICROSCOPIC
Leukocytes, UA: NEGATIVE
Nitrite: NEGATIVE
Specific Gravity, Urine: 1.011
pH: 5.5

## 2011-03-22 LAB — APTT: aPTT: 33

## 2011-03-22 LAB — URINE MICROSCOPIC-ADD ON

## 2011-09-07 DIAGNOSIS — M5430 Sciatica, unspecified side: Secondary | ICD-10-CM | POA: Diagnosis not present

## 2011-09-07 DIAGNOSIS — Z79899 Other long term (current) drug therapy: Secondary | ICD-10-CM | POA: Diagnosis not present

## 2011-09-07 DIAGNOSIS — E785 Hyperlipidemia, unspecified: Secondary | ICD-10-CM | POA: Diagnosis not present

## 2011-09-25 DIAGNOSIS — M25559 Pain in unspecified hip: Secondary | ICD-10-CM | POA: Diagnosis not present

## 2011-10-22 DIAGNOSIS — M25559 Pain in unspecified hip: Secondary | ICD-10-CM | POA: Diagnosis not present

## 2011-11-01 ENCOUNTER — Ambulatory Visit: Payer: Medicare Other

## 2012-02-18 DIAGNOSIS — Z1231 Encounter for screening mammogram for malignant neoplasm of breast: Secondary | ICD-10-CM | POA: Diagnosis not present

## 2012-03-25 DIAGNOSIS — Z23 Encounter for immunization: Secondary | ICD-10-CM | POA: Diagnosis not present

## 2012-06-04 DIAGNOSIS — J019 Acute sinusitis, unspecified: Secondary | ICD-10-CM | POA: Diagnosis not present

## 2012-06-04 DIAGNOSIS — Z79899 Other long term (current) drug therapy: Secondary | ICD-10-CM | POA: Diagnosis not present

## 2012-10-16 DIAGNOSIS — M7981 Nontraumatic hematoma of soft tissue: Secondary | ICD-10-CM | POA: Diagnosis not present

## 2012-10-16 DIAGNOSIS — S61409A Unspecified open wound of unspecified hand, initial encounter: Secondary | ICD-10-CM | POA: Diagnosis not present

## 2012-10-20 DIAGNOSIS — Z8241 Family history of sudden cardiac death: Secondary | ICD-10-CM | POA: Diagnosis not present

## 2012-10-20 DIAGNOSIS — R079 Chest pain, unspecified: Secondary | ICD-10-CM | POA: Diagnosis not present

## 2012-10-20 DIAGNOSIS — E7800 Pure hypercholesterolemia, unspecified: Secondary | ICD-10-CM | POA: Diagnosis not present

## 2012-10-20 DIAGNOSIS — R002 Palpitations: Secondary | ICD-10-CM | POA: Diagnosis not present

## 2012-10-20 DIAGNOSIS — M542 Cervicalgia: Secondary | ICD-10-CM | POA: Diagnosis not present

## 2012-12-09 DIAGNOSIS — R0602 Shortness of breath: Secondary | ICD-10-CM | POA: Diagnosis not present

## 2012-12-09 DIAGNOSIS — R0789 Other chest pain: Secondary | ICD-10-CM | POA: Diagnosis not present

## 2012-12-09 DIAGNOSIS — R002 Palpitations: Secondary | ICD-10-CM | POA: Diagnosis not present

## 2012-12-18 DIAGNOSIS — R0609 Other forms of dyspnea: Secondary | ICD-10-CM | POA: Diagnosis not present

## 2012-12-18 DIAGNOSIS — R0989 Other specified symptoms and signs involving the circulatory and respiratory systems: Secondary | ICD-10-CM | POA: Diagnosis not present

## 2012-12-31 DIAGNOSIS — R079 Chest pain, unspecified: Secondary | ICD-10-CM | POA: Diagnosis not present

## 2013-01-07 DIAGNOSIS — I4892 Unspecified atrial flutter: Secondary | ICD-10-CM | POA: Diagnosis not present

## 2013-01-07 DIAGNOSIS — R0789 Other chest pain: Secondary | ICD-10-CM | POA: Diagnosis not present

## 2013-01-07 DIAGNOSIS — E785 Hyperlipidemia, unspecified: Secondary | ICD-10-CM | POA: Diagnosis not present

## 2013-01-22 DIAGNOSIS — Z23 Encounter for immunization: Secondary | ICD-10-CM | POA: Diagnosis not present

## 2013-01-22 DIAGNOSIS — M5430 Sciatica, unspecified side: Secondary | ICD-10-CM | POA: Diagnosis not present

## 2013-01-22 DIAGNOSIS — E559 Vitamin D deficiency, unspecified: Secondary | ICD-10-CM | POA: Diagnosis not present

## 2013-01-22 DIAGNOSIS — Z0000 Encounter for general adult medical examination without abnormal findings: Secondary | ICD-10-CM | POA: Diagnosis not present

## 2013-01-22 DIAGNOSIS — E785 Hyperlipidemia, unspecified: Secondary | ICD-10-CM | POA: Diagnosis not present

## 2013-01-22 DIAGNOSIS — R7309 Other abnormal glucose: Secondary | ICD-10-CM | POA: Diagnosis not present

## 2013-03-29 IMAGING — CR DG CHEST 2V
2 series · 2 of 2 positions shown · non-contrast
Comparison: Chest x-ray of 07/20/2010

CLINICAL DATA: Chronic cough

CHEST - 2 VIEW

[view not recorded (1 of 2)]
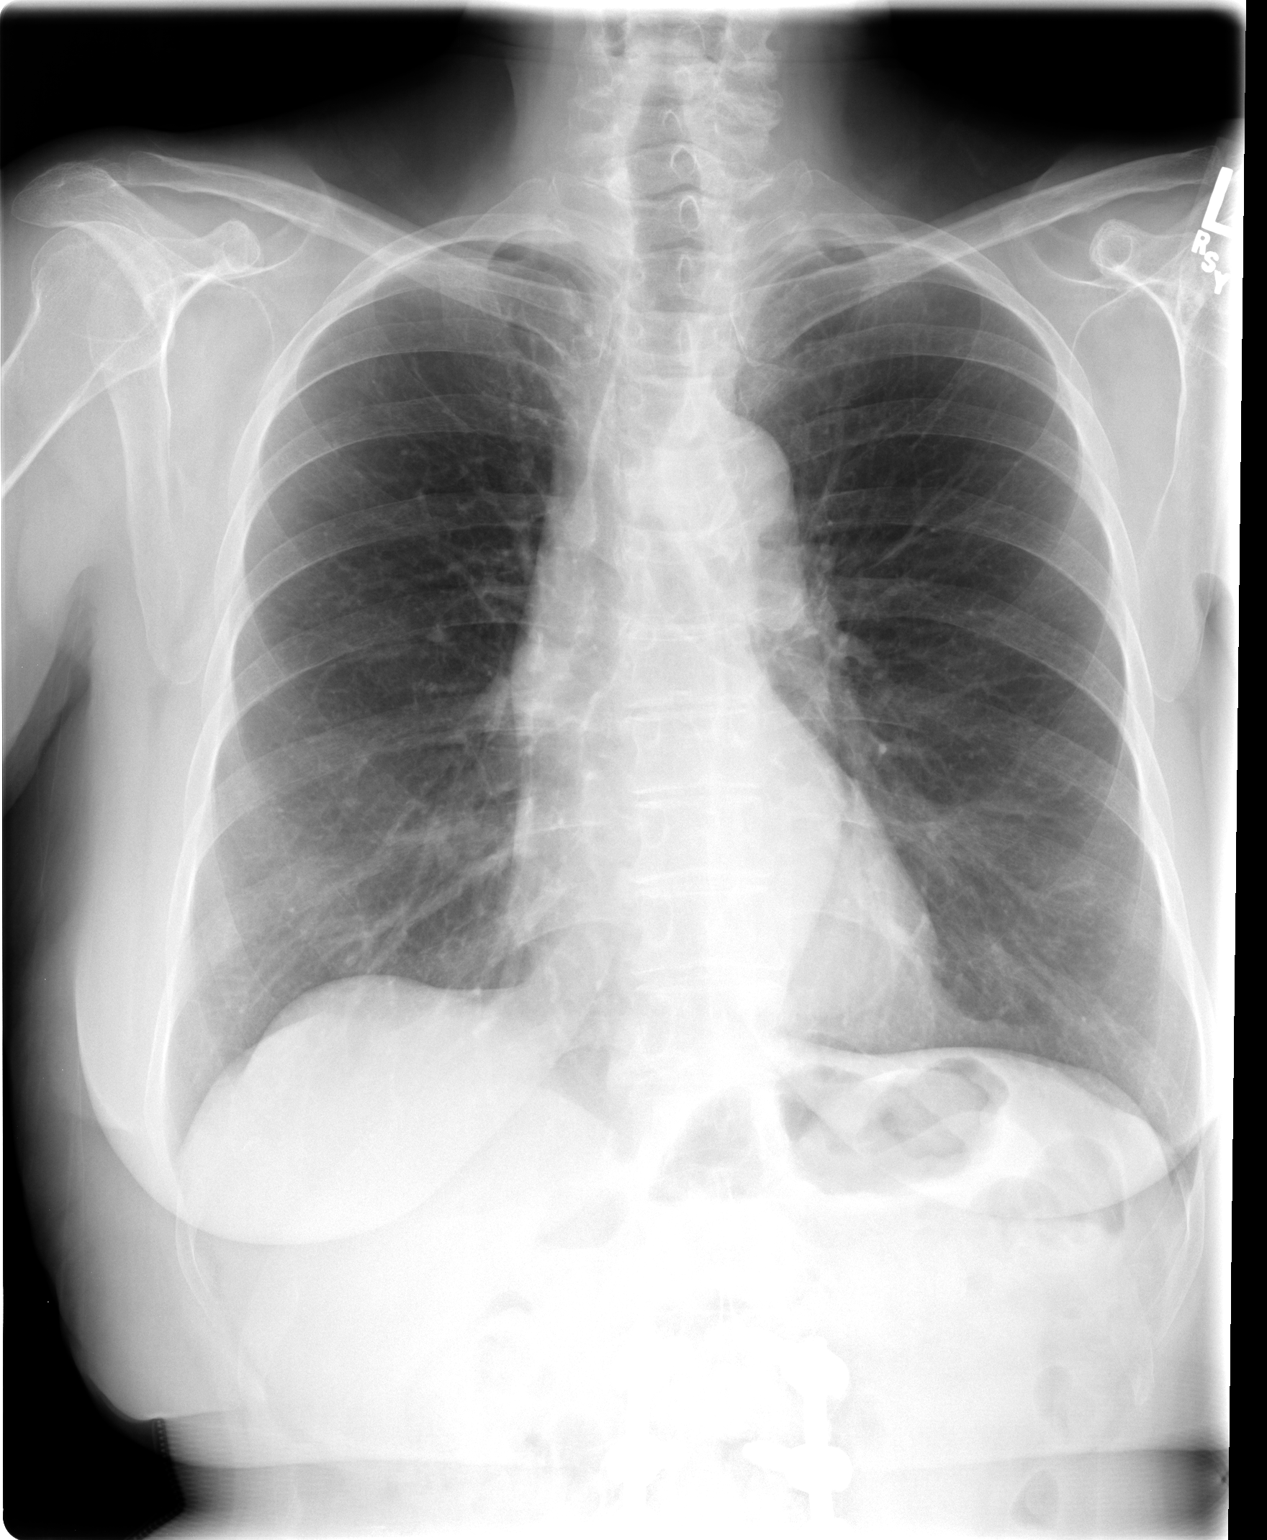

[view not recorded (2 of 2)]
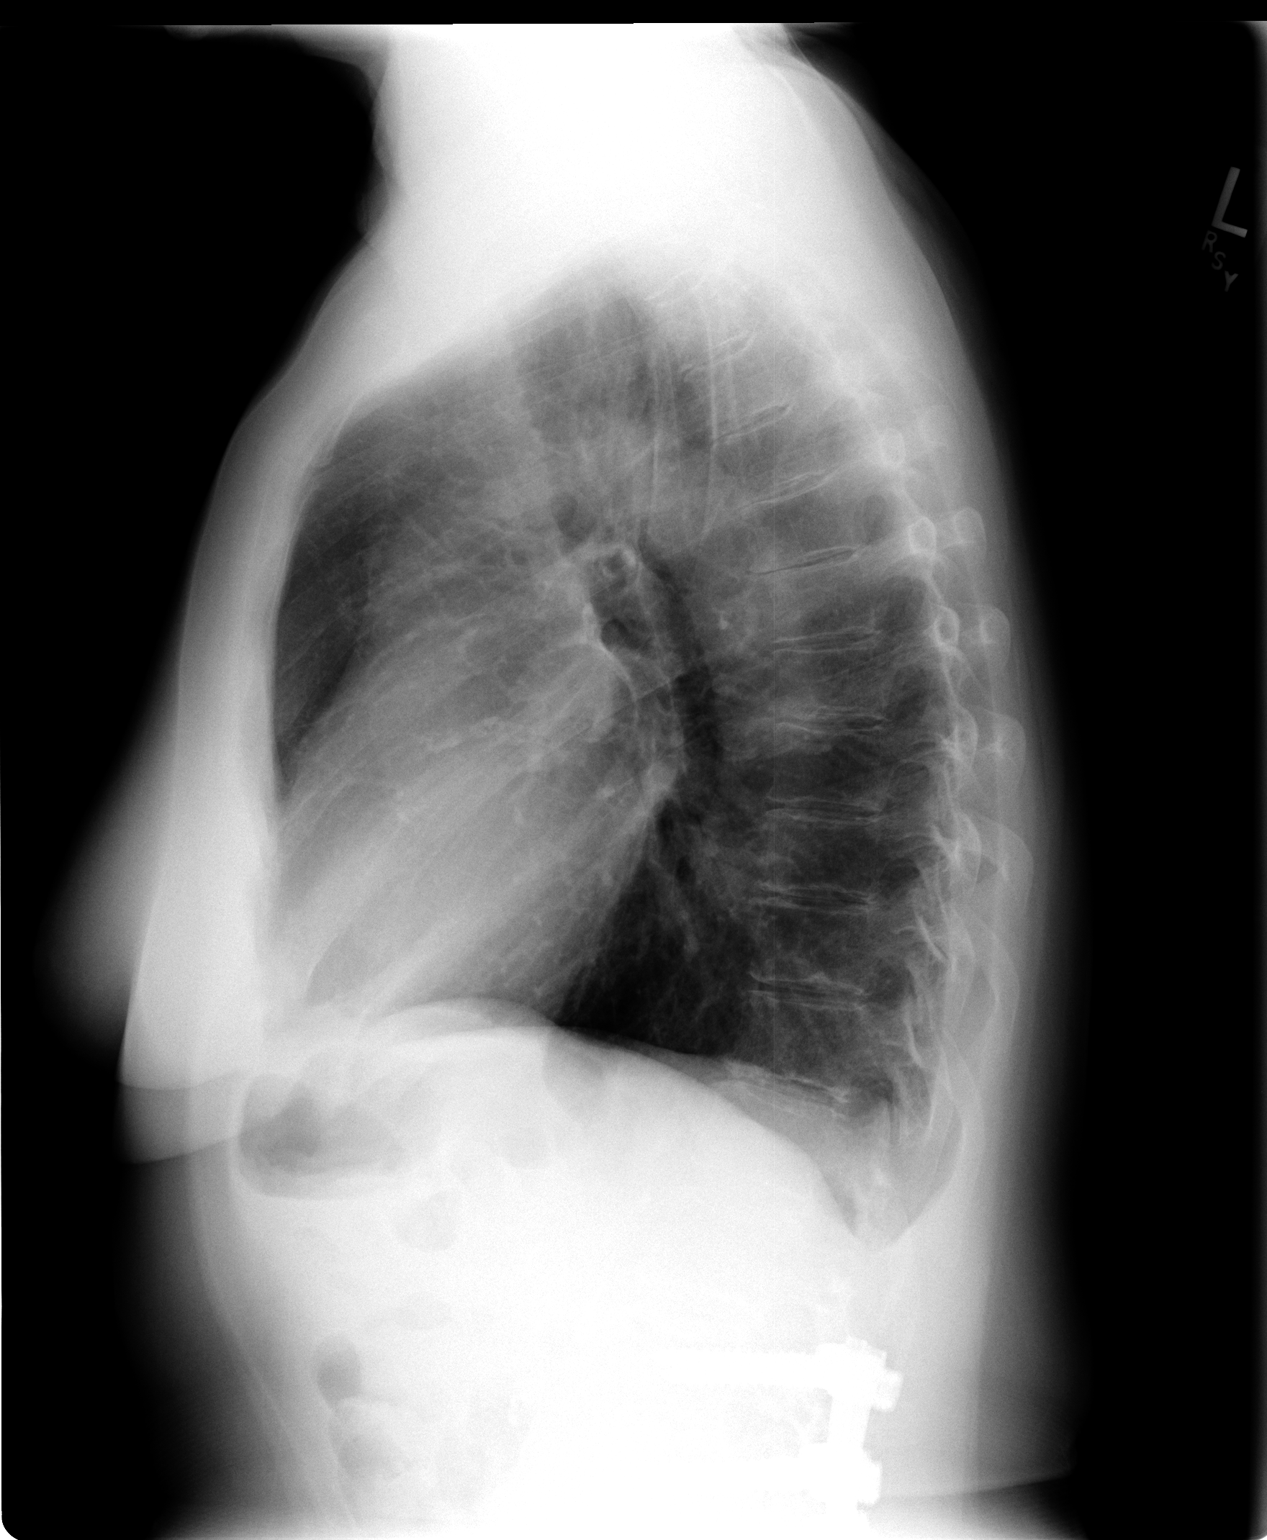

[2 of 2 positions shown; findings below may reference images not displayed]

FINDINGS: The lungs are clear.  Mediastinal contours appear normal.
The heart is within normal limits in size.  No acute bony
abnormality is seen.  Hardware for fusion of the lumbar spine is
noted.
IMPRESSION: Stable chest x-ray.  No active lung disease.

## 2013-04-09 DIAGNOSIS — Z23 Encounter for immunization: Secondary | ICD-10-CM | POA: Diagnosis not present

## 2013-04-23 DIAGNOSIS — Z1231 Encounter for screening mammogram for malignant neoplasm of breast: Secondary | ICD-10-CM | POA: Diagnosis not present

## 2013-05-13 DIAGNOSIS — E785 Hyperlipidemia, unspecified: Secondary | ICD-10-CM | POA: Diagnosis not present

## 2013-05-13 DIAGNOSIS — I4892 Unspecified atrial flutter: Secondary | ICD-10-CM | POA: Diagnosis not present

## 2013-07-21 DIAGNOSIS — M5430 Sciatica, unspecified side: Secondary | ICD-10-CM | POA: Diagnosis not present

## 2013-07-21 DIAGNOSIS — E785 Hyperlipidemia, unspecified: Secondary | ICD-10-CM | POA: Diagnosis not present

## 2013-07-21 DIAGNOSIS — F411 Generalized anxiety disorder: Secondary | ICD-10-CM | POA: Diagnosis not present

## 2013-07-21 DIAGNOSIS — J019 Acute sinusitis, unspecified: Secondary | ICD-10-CM | POA: Diagnosis not present

## 2014-02-02 DIAGNOSIS — M129 Arthropathy, unspecified: Secondary | ICD-10-CM | POA: Diagnosis not present

## 2014-02-02 DIAGNOSIS — E559 Vitamin D deficiency, unspecified: Secondary | ICD-10-CM | POA: Diagnosis not present

## 2014-02-02 DIAGNOSIS — Z0000 Encounter for general adult medical examination without abnormal findings: Secondary | ICD-10-CM | POA: Diagnosis not present

## 2014-02-02 DIAGNOSIS — J309 Allergic rhinitis, unspecified: Secondary | ICD-10-CM | POA: Diagnosis not present

## 2014-02-02 DIAGNOSIS — M810 Age-related osteoporosis without current pathological fracture: Secondary | ICD-10-CM | POA: Diagnosis not present

## 2014-02-02 DIAGNOSIS — Z79899 Other long term (current) drug therapy: Secondary | ICD-10-CM | POA: Diagnosis not present

## 2014-02-02 DIAGNOSIS — E782 Mixed hyperlipidemia: Secondary | ICD-10-CM | POA: Diagnosis not present

## 2014-02-28 DIAGNOSIS — R0789 Other chest pain: Secondary | ICD-10-CM | POA: Diagnosis not present

## 2014-02-28 DIAGNOSIS — Z79899 Other long term (current) drug therapy: Secondary | ICD-10-CM | POA: Diagnosis not present

## 2014-02-28 DIAGNOSIS — R071 Chest pain on breathing: Secondary | ICD-10-CM | POA: Diagnosis not present

## 2014-02-28 DIAGNOSIS — R079 Chest pain, unspecified: Secondary | ICD-10-CM | POA: Diagnosis not present

## 2014-02-28 DIAGNOSIS — R9431 Abnormal electrocardiogram [ECG] [EKG]: Secondary | ICD-10-CM | POA: Diagnosis not present

## 2014-02-28 DIAGNOSIS — IMO0001 Reserved for inherently not codable concepts without codable children: Secondary | ICD-10-CM | POA: Diagnosis not present

## 2014-02-28 DIAGNOSIS — Z7982 Long term (current) use of aspirin: Secondary | ICD-10-CM | POA: Diagnosis not present

## 2014-03-16 DIAGNOSIS — I4892 Unspecified atrial flutter: Secondary | ICD-10-CM | POA: Diagnosis not present

## 2014-03-16 DIAGNOSIS — M810 Age-related osteoporosis without current pathological fracture: Secondary | ICD-10-CM | POA: Diagnosis not present

## 2014-03-16 DIAGNOSIS — E785 Hyperlipidemia, unspecified: Secondary | ICD-10-CM | POA: Diagnosis not present

## 2014-04-27 DIAGNOSIS — Z1231 Encounter for screening mammogram for malignant neoplasm of breast: Secondary | ICD-10-CM | POA: Diagnosis not present

## 2014-04-27 DIAGNOSIS — Z23 Encounter for immunization: Secondary | ICD-10-CM | POA: Diagnosis not present

## 2014-06-29 DIAGNOSIS — G4700 Insomnia, unspecified: Secondary | ICD-10-CM | POA: Diagnosis not present

## 2014-06-29 DIAGNOSIS — E780 Pure hypercholesterolemia: Secondary | ICD-10-CM | POA: Diagnosis not present

## 2014-06-29 DIAGNOSIS — M810 Age-related osteoporosis without current pathological fracture: Secondary | ICD-10-CM | POA: Diagnosis not present

## 2014-06-29 DIAGNOSIS — F411 Generalized anxiety disorder: Secondary | ICD-10-CM | POA: Diagnosis not present

## 2014-07-13 DIAGNOSIS — E780 Pure hypercholesterolemia: Secondary | ICD-10-CM | POA: Diagnosis not present

## 2014-07-13 DIAGNOSIS — K219 Gastro-esophageal reflux disease without esophagitis: Secondary | ICD-10-CM | POA: Diagnosis not present

## 2014-07-13 DIAGNOSIS — W06XXXA Fall from bed, initial encounter: Secondary | ICD-10-CM | POA: Diagnosis not present

## 2014-07-13 DIAGNOSIS — Z7982 Long term (current) use of aspirin: Secondary | ICD-10-CM | POA: Diagnosis not present

## 2014-07-13 DIAGNOSIS — S76012A Strain of muscle, fascia and tendon of left hip, initial encounter: Secondary | ICD-10-CM | POA: Diagnosis not present

## 2014-07-13 DIAGNOSIS — R296 Repeated falls: Secondary | ICD-10-CM | POA: Diagnosis not present

## 2014-07-13 DIAGNOSIS — Z79899 Other long term (current) drug therapy: Secondary | ICD-10-CM | POA: Diagnosis not present

## 2014-09-28 DIAGNOSIS — E785 Hyperlipidemia, unspecified: Secondary | ICD-10-CM | POA: Diagnosis not present

## 2014-09-28 DIAGNOSIS — I4892 Unspecified atrial flutter: Secondary | ICD-10-CM | POA: Diagnosis not present

## 2014-12-29 DIAGNOSIS — G4700 Insomnia, unspecified: Secondary | ICD-10-CM | POA: Diagnosis not present

## 2014-12-29 DIAGNOSIS — J309 Allergic rhinitis, unspecified: Secondary | ICD-10-CM | POA: Diagnosis not present

## 2014-12-29 DIAGNOSIS — M629 Disorder of muscle, unspecified: Secondary | ICD-10-CM | POA: Diagnosis not present

## 2014-12-29 DIAGNOSIS — F411 Generalized anxiety disorder: Secondary | ICD-10-CM | POA: Diagnosis not present

## 2015-01-04 ENCOUNTER — Ambulatory Visit (INDEPENDENT_AMBULATORY_CARE_PROVIDER_SITE_OTHER): Payer: Medicare Other | Admitting: Podiatry

## 2015-01-04 ENCOUNTER — Encounter: Payer: Self-pay | Admitting: Podiatry

## 2015-01-04 VITALS — BP 131/68 | HR 74 | Ht 60.0 in | Wt 133.0 lb

## 2015-01-04 DIAGNOSIS — M792 Neuralgia and neuritis, unspecified: Secondary | ICD-10-CM | POA: Insufficient documentation

## 2015-01-04 DIAGNOSIS — M7742 Metatarsalgia, left foot: Secondary | ICD-10-CM | POA: Diagnosis not present

## 2015-01-04 DIAGNOSIS — G5791 Unspecified mononeuropathy of right lower limb: Secondary | ICD-10-CM

## 2015-01-04 DIAGNOSIS — M7741 Metatarsalgia, right foot: Secondary | ICD-10-CM | POA: Insufficient documentation

## 2015-01-04 DIAGNOSIS — M659 Synovitis and tenosynovitis, unspecified: Secondary | ICD-10-CM | POA: Insufficient documentation

## 2015-01-04 DIAGNOSIS — M65971 Unspecified synovitis and tenosynovitis, right ankle and foot: Secondary | ICD-10-CM | POA: Insufficient documentation

## 2015-01-04 NOTE — Progress Notes (Signed)
Subjective: S/P Ladene ArtistAustin Lt, HT with tendon transfer 2nd right. 74 year old female presents with painful right foot. Patient points sharp protruding bone mass over the first MCJ right, dorsally protruding 5th metatarsal head right foot being painful for many years. Unable to wear any closed in shoes. Seeking long lasting treatment options.  Objective: Dermatologic: Normal findings. No open lesions. Vascular: All pedal pulses are palpable. Neurologic: All epicritic and tactile sensations are grossly intact. Orthopedic: Palpable enlarged dorsal spur at the first mCJ area right. Dorsally protruding 5th metatarsal head right. Radiographic examination reveal high arched cavus type foot with plantar heel spur. Dorsal first MCJ show minimum change. 5th Metatarsal head on right foot shoe minimum change.   Assessment: Painful spur 1st MCJ right. Tenosynovitis 1st MCJ right. Possible Neuralgia dorsomedial cutaneous nerve right.  Metatarsalgia 5th right with weight bearing.  Plan: Reviewed clinical findings and available treatment options, change in shoe gear, orthotics, metatarsal binder, injection etc. Patient request surgical options. Surgery consent form reviewed for Resection bone spur from 1st MCJ area with nerve decompression, metatarsal osteotomy 5th right foot.

## 2015-02-03 DIAGNOSIS — M2010 Hallux valgus (acquired), unspecified foot: Secondary | ICD-10-CM | POA: Diagnosis not present

## 2015-02-03 DIAGNOSIS — M2570 Osteophyte, unspecified joint: Secondary | ICD-10-CM | POA: Diagnosis not present

## 2015-02-03 DIAGNOSIS — M779 Enthesopathy, unspecified: Secondary | ICD-10-CM | POA: Diagnosis not present

## 2015-02-03 HISTORY — PX: OTHER SURGICAL HISTORY: SHX169

## 2015-02-03 HISTORY — PX: OSTEOTOMY: SHX137

## 2015-02-04 ENCOUNTER — Telehealth: Payer: Self-pay | Admitting: *Deleted

## 2015-02-04 NOTE — Telephone Encounter (Signed)
02/04/2015 Dr. Raynald Kemp, Patient called this am and ask can we call her something for nausea. She has been throwing up x 4 last night.

## 2015-02-08 ENCOUNTER — Encounter: Payer: Self-pay | Admitting: Podiatry

## 2015-02-08 ENCOUNTER — Ambulatory Visit (INDEPENDENT_AMBULATORY_CARE_PROVIDER_SITE_OTHER): Payer: Medicare Other | Admitting: Podiatry

## 2015-02-08 VITALS — BP 134/76 | HR 85

## 2015-02-08 DIAGNOSIS — Z9889 Other specified postprocedural states: Secondary | ICD-10-CM

## 2015-02-08 NOTE — Progress Notes (Signed)
5 day post op following 5th Met osteotomy and spur removal mid foot right. No complaints other than Nausea with pain medication. Called in anti nausea medication. Doing better now. Dressing changed. Wound is clean and dry. Normal post op wound.  Continue in Surgical shoes.

## 2015-02-08 NOTE — Patient Instructions (Signed)
Normal post op wound. Continue using surgical shoe for ambulation.

## 2015-02-09 ENCOUNTER — Telehealth: Payer: Self-pay | Admitting: *Deleted

## 2015-02-09 ENCOUNTER — Encounter: Payer: Self-pay | Admitting: *Deleted

## 2015-02-09 NOTE — Telephone Encounter (Signed)
02/09/15 Patient called this afternoon and states she forgot to mention to you when she was in here that she has been very constipated, its painful and she sees some blood. What can she do?

## 2015-02-15 ENCOUNTER — Ambulatory Visit (INDEPENDENT_AMBULATORY_CARE_PROVIDER_SITE_OTHER): Payer: Medicare Other | Admitting: Podiatry

## 2015-02-15 ENCOUNTER — Encounter: Payer: Self-pay | Admitting: Podiatry

## 2015-02-15 VITALS — BP 141/92 | HR 73

## 2015-02-15 DIAGNOSIS — Z9889 Other specified postprocedural states: Secondary | ICD-10-CM

## 2015-02-15 NOTE — Progress Notes (Signed)
2 weeks post op. Normal wound healing. Sutures removed. Continue in surgical shoes. Return in 2 weeks.

## 2015-02-15 NOTE — Patient Instructions (Signed)
2 weeks post op. Normal wound healing. Sutures removed. Ok to get wet. Stay in Surgical shoes. Return in 2 weeks.

## 2015-02-18 ENCOUNTER — Telehealth: Payer: Self-pay | Admitting: *Deleted

## 2015-02-18 NOTE — Telephone Encounter (Signed)
02/18/2015 AM Patient called and ask can we call in her some medication for nausea.

## 2015-03-01 ENCOUNTER — Encounter: Payer: Self-pay | Admitting: Podiatry

## 2015-03-01 ENCOUNTER — Ambulatory Visit (INDEPENDENT_AMBULATORY_CARE_PROVIDER_SITE_OTHER): Payer: Medicare Other | Admitting: Podiatry

## 2015-03-01 DIAGNOSIS — Z9889 Other specified postprocedural states: Secondary | ICD-10-CM

## 2015-03-01 NOTE — Patient Instructions (Signed)
4 weeks post op. Normal wound healing with some mid foot pain. Continue use surgical shoe and use compression stockinet.  Return in 3 weeks.

## 2015-03-01 NOTE — Progress Notes (Signed)
4 weeks Metatarsal osteotomy with screw fixation 5th right, mid tarsal spur resection right foot.  No edema or erythema noted. Tender at mid foot area over incision site and plantar arch area right foot. Assessment: Normal post op wound right foot. Plan: Stay in surgical shoe for another 2-3 weeks.

## 2015-03-22 ENCOUNTER — Encounter: Payer: Medicare Other | Admitting: Podiatry

## 2015-03-29 ENCOUNTER — Encounter: Payer: Self-pay | Admitting: Podiatry

## 2015-03-29 ENCOUNTER — Ambulatory Visit (INDEPENDENT_AMBULATORY_CARE_PROVIDER_SITE_OTHER): Payer: Medicare Other | Admitting: Podiatry

## 2015-03-29 VITALS — BP 143/94 | HR 87

## 2015-03-29 DIAGNOSIS — Z9889 Other specified postprocedural states: Secondary | ICD-10-CM

## 2015-03-29 NOTE — Patient Instructions (Signed)
8 weeks post op visit. Doing well other than arthritic pain at mid foot area. May benefit from custom/otc orthotics. Return as needed.

## 2015-03-29 NOTE — Progress Notes (Signed)
Subjective: 8 weeks following Metatarsal osteotomy with screw fixation 5th right, mid tarsal spur resection right foot.  No discomfort at 5th Metatarsal area.  Patient complained of pain at instep area right foot.   Objective: Tender along the left foot surgical area including plantar arch right foot. Increased warmth compared with the contra lateral side at mid foot area. Good wound healing at the 5th MPJ area. No edema noted.  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   Plan: Advised to wear regular tennis shoes with custom or otc orthotics. Take Tylenol if needed. Patient cannot take Aspirin. Return as needed.

## 2015-04-01 DIAGNOSIS — F329 Major depressive disorder, single episode, unspecified: Secondary | ICD-10-CM | POA: Diagnosis not present

## 2015-04-01 DIAGNOSIS — Z79899 Other long term (current) drug therapy: Secondary | ICD-10-CM | POA: Diagnosis not present

## 2015-04-01 DIAGNOSIS — F411 Generalized anxiety disorder: Secondary | ICD-10-CM | POA: Diagnosis not present

## 2015-04-01 DIAGNOSIS — R030 Elevated blood-pressure reading, without diagnosis of hypertension: Secondary | ICD-10-CM | POA: Diagnosis not present

## 2015-04-01 DIAGNOSIS — G4700 Insomnia, unspecified: Secondary | ICD-10-CM | POA: Diagnosis not present

## 2015-04-01 DIAGNOSIS — R413 Other amnesia: Secondary | ICD-10-CM | POA: Diagnosis not present

## 2015-04-01 DIAGNOSIS — Z0000 Encounter for general adult medical examination without abnormal findings: Secondary | ICD-10-CM | POA: Diagnosis not present

## 2015-04-05 DIAGNOSIS — I4892 Unspecified atrial flutter: Secondary | ICD-10-CM | POA: Diagnosis not present

## 2015-04-05 DIAGNOSIS — R0789 Other chest pain: Secondary | ICD-10-CM | POA: Diagnosis not present

## 2015-04-05 DIAGNOSIS — E785 Hyperlipidemia, unspecified: Secondary | ICD-10-CM | POA: Diagnosis not present

## 2015-04-08 DIAGNOSIS — R0789 Other chest pain: Secondary | ICD-10-CM | POA: Diagnosis not present

## 2015-04-12 DIAGNOSIS — Z23 Encounter for immunization: Secondary | ICD-10-CM | POA: Diagnosis not present

## 2015-04-13 DIAGNOSIS — R1310 Dysphagia, unspecified: Secondary | ICD-10-CM | POA: Diagnosis not present

## 2015-04-13 DIAGNOSIS — Z86010 Personal history of colonic polyps: Secondary | ICD-10-CM | POA: Diagnosis not present

## 2015-04-13 DIAGNOSIS — K219 Gastro-esophageal reflux disease without esophagitis: Secondary | ICD-10-CM | POA: Diagnosis not present

## 2015-04-29 DIAGNOSIS — H6090 Unspecified otitis externa, unspecified ear: Secondary | ICD-10-CM | POA: Diagnosis not present

## 2015-04-29 DIAGNOSIS — H9202 Otalgia, left ear: Secondary | ICD-10-CM | POA: Diagnosis not present

## 2015-05-03 DIAGNOSIS — K222 Esophageal obstruction: Secondary | ICD-10-CM | POA: Diagnosis not present

## 2015-05-03 DIAGNOSIS — K449 Diaphragmatic hernia without obstruction or gangrene: Secondary | ICD-10-CM | POA: Diagnosis not present

## 2015-05-03 DIAGNOSIS — K209 Esophagitis, unspecified: Secondary | ICD-10-CM | POA: Diagnosis not present

## 2015-05-03 DIAGNOSIS — K208 Other esophagitis: Secondary | ICD-10-CM | POA: Diagnosis not present

## 2015-05-03 DIAGNOSIS — R1310 Dysphagia, unspecified: Secondary | ICD-10-CM | POA: Diagnosis not present

## 2015-05-04 DIAGNOSIS — H9209 Otalgia, unspecified ear: Secondary | ICD-10-CM | POA: Diagnosis not present

## 2015-05-04 DIAGNOSIS — R42 Dizziness and giddiness: Secondary | ICD-10-CM | POA: Diagnosis not present

## 2015-05-04 DIAGNOSIS — R201 Hypoesthesia of skin: Secondary | ICD-10-CM | POA: Diagnosis not present

## 2015-05-13 DIAGNOSIS — M2550 Pain in unspecified joint: Secondary | ICD-10-CM | POA: Diagnosis not present

## 2015-05-13 DIAGNOSIS — G4700 Insomnia, unspecified: Secondary | ICD-10-CM | POA: Diagnosis not present

## 2015-05-13 DIAGNOSIS — F411 Generalized anxiety disorder: Secondary | ICD-10-CM | POA: Diagnosis not present

## 2015-05-13 DIAGNOSIS — Z79899 Other long term (current) drug therapy: Secondary | ICD-10-CM | POA: Diagnosis not present

## 2015-05-13 DIAGNOSIS — K921 Melena: Secondary | ICD-10-CM | POA: Diagnosis not present

## 2015-05-13 DIAGNOSIS — E782 Mixed hyperlipidemia: Secondary | ICD-10-CM | POA: Diagnosis not present

## 2015-05-13 DIAGNOSIS — R413 Other amnesia: Secondary | ICD-10-CM | POA: Diagnosis not present

## 2015-05-20 DIAGNOSIS — Z1231 Encounter for screening mammogram for malignant neoplasm of breast: Secondary | ICD-10-CM | POA: Diagnosis not present

## 2015-05-31 DIAGNOSIS — Z9229 Personal history of other drug therapy: Secondary | ICD-10-CM | POA: Diagnosis not present

## 2015-05-31 DIAGNOSIS — M47812 Spondylosis without myelopathy or radiculopathy, cervical region: Secondary | ICD-10-CM | POA: Diagnosis not present

## 2015-05-31 DIAGNOSIS — D8989 Other specified disorders involving the immune mechanism, not elsewhere classified: Secondary | ICD-10-CM | POA: Diagnosis not present

## 2015-05-31 DIAGNOSIS — M19042 Primary osteoarthritis, left hand: Secondary | ICD-10-CM | POA: Diagnosis not present

## 2015-05-31 DIAGNOSIS — M19041 Primary osteoarthritis, right hand: Secondary | ICD-10-CM | POA: Diagnosis not present

## 2015-05-31 DIAGNOSIS — M542 Cervicalgia: Secondary | ICD-10-CM | POA: Diagnosis not present

## 2015-06-06 DIAGNOSIS — K449 Diaphragmatic hernia without obstruction or gangrene: Secondary | ICD-10-CM | POA: Diagnosis not present

## 2015-06-06 DIAGNOSIS — K208 Other esophagitis: Secondary | ICD-10-CM | POA: Diagnosis not present

## 2015-06-06 DIAGNOSIS — K222 Esophageal obstruction: Secondary | ICD-10-CM | POA: Diagnosis not present

## 2015-08-09 DIAGNOSIS — E785 Hyperlipidemia, unspecified: Secondary | ICD-10-CM | POA: Diagnosis not present

## 2015-08-09 DIAGNOSIS — I4892 Unspecified atrial flutter: Secondary | ICD-10-CM | POA: Diagnosis not present

## 2015-08-09 DIAGNOSIS — R0789 Other chest pain: Secondary | ICD-10-CM | POA: Diagnosis not present

## 2015-08-16 DIAGNOSIS — S0990XD Unspecified injury of head, subsequent encounter: Secondary | ICD-10-CM | POA: Diagnosis not present

## 2015-08-16 DIAGNOSIS — R413 Other amnesia: Secondary | ICD-10-CM | POA: Diagnosis not present

## 2015-08-16 DIAGNOSIS — G4700 Insomnia, unspecified: Secondary | ICD-10-CM | POA: Diagnosis not present

## 2015-08-16 DIAGNOSIS — F329 Major depressive disorder, single episode, unspecified: Secondary | ICD-10-CM | POA: Diagnosis not present

## 2015-09-07 DIAGNOSIS — R768 Other specified abnormal immunological findings in serum: Secondary | ICD-10-CM | POA: Diagnosis not present

## 2015-09-07 DIAGNOSIS — M2550 Pain in unspecified joint: Secondary | ICD-10-CM | POA: Diagnosis not present

## 2015-09-07 DIAGNOSIS — M791 Myalgia: Secondary | ICD-10-CM | POA: Diagnosis not present

## 2015-09-07 DIAGNOSIS — M542 Cervicalgia: Secondary | ICD-10-CM | POA: Diagnosis not present

## 2015-09-11 ENCOUNTER — Encounter (HOSPITAL_COMMUNITY): Payer: Self-pay | Admitting: Emergency Medicine

## 2015-09-11 ENCOUNTER — Emergency Department (HOSPITAL_COMMUNITY): Payer: Medicare Other

## 2015-09-11 ENCOUNTER — Emergency Department (HOSPITAL_COMMUNITY)
Admission: EM | Admit: 2015-09-11 | Discharge: 2015-09-11 | Disposition: A | Discharge status: Home / Self Care | Payer: Medicare Other | Attending: Emergency Medicine | Admitting: Emergency Medicine

## 2015-09-11 DIAGNOSIS — Y9389 Activity, other specified: Secondary | ICD-10-CM | POA: Diagnosis not present

## 2015-09-11 DIAGNOSIS — Y92009 Unspecified place in unspecified non-institutional (private) residence as the place of occurrence of the external cause: Secondary | ICD-10-CM | POA: Diagnosis not present

## 2015-09-11 DIAGNOSIS — Y999 Unspecified external cause status: Secondary | ICD-10-CM | POA: Diagnosis not present

## 2015-09-11 DIAGNOSIS — S0990XA Unspecified injury of head, initial encounter: Secondary | ICD-10-CM | POA: Insufficient documentation

## 2015-09-11 DIAGNOSIS — W01198A Fall on same level from slipping, tripping and stumbling with subsequent striking against other object, initial encounter: Secondary | ICD-10-CM | POA: Diagnosis not present

## 2015-09-11 DIAGNOSIS — S199XXA Unspecified injury of neck, initial encounter: Secondary | ICD-10-CM | POA: Diagnosis not present

## 2015-09-11 NOTE — ED Notes (Signed)
Pt. tripped on her cat and fell forward at home this evening , hit her head against tile floor , denies LOC/ambulatory , alert and oriented , reports mild pain at left forehead with swelling .

## 2015-10-02 ENCOUNTER — Emergency Department (HOSPITAL_COMMUNITY): Payer: Medicare Other

## 2015-10-02 ENCOUNTER — Observation Stay (HOSPITAL_COMMUNITY)
Admission: EM | Admit: 2015-10-02 | Discharge: 2015-10-03 | Disposition: A | Discharge status: Home / Self Care | Payer: Medicare Other | Attending: Internal Medicine | Admitting: Internal Medicine

## 2015-10-02 ENCOUNTER — Encounter (HOSPITAL_COMMUNITY): Payer: Self-pay | Admitting: Emergency Medicine

## 2015-10-02 DIAGNOSIS — E785 Hyperlipidemia, unspecified: Secondary | ICD-10-CM | POA: Insufficient documentation

## 2015-10-02 DIAGNOSIS — Z7983 Long term (current) use of bisphosphonates: Secondary | ICD-10-CM | POA: Insufficient documentation

## 2015-10-02 DIAGNOSIS — Y92002 Bathroom of unspecified non-institutional (private) residence single-family (private) house as the place of occurrence of the external cause: Secondary | ICD-10-CM | POA: Diagnosis not present

## 2015-10-02 DIAGNOSIS — W182XXA Fall in (into) shower or empty bathtub, initial encounter: Secondary | ICD-10-CM | POA: Insufficient documentation

## 2015-10-02 DIAGNOSIS — K219 Gastro-esophageal reflux disease without esophagitis: Secondary | ICD-10-CM | POA: Diagnosis not present

## 2015-10-02 DIAGNOSIS — Z880 Allergy status to penicillin: Secondary | ICD-10-CM | POA: Insufficient documentation

## 2015-10-02 DIAGNOSIS — Z79899 Other long term (current) drug therapy: Secondary | ICD-10-CM | POA: Diagnosis not present

## 2015-10-02 DIAGNOSIS — N9489 Other specified conditions associated with female genital organs and menstrual cycle: Secondary | ICD-10-CM | POA: Diagnosis not present

## 2015-10-02 DIAGNOSIS — Z7901 Long term (current) use of anticoagulants: Secondary | ICD-10-CM | POA: Diagnosis not present

## 2015-10-02 DIAGNOSIS — Z7982 Long term (current) use of aspirin: Secondary | ICD-10-CM | POA: Insufficient documentation

## 2015-10-02 DIAGNOSIS — F418 Other specified anxiety disorders: Secondary | ICD-10-CM | POA: Insufficient documentation

## 2015-10-02 DIAGNOSIS — S0990XA Unspecified injury of head, initial encounter: Secondary | ICD-10-CM | POA: Diagnosis not present

## 2015-10-02 DIAGNOSIS — M797 Fibromyalgia: Secondary | ICD-10-CM | POA: Diagnosis not present

## 2015-10-02 DIAGNOSIS — I4892 Unspecified atrial flutter: Secondary | ICD-10-CM | POA: Diagnosis not present

## 2015-10-02 DIAGNOSIS — S3992XA Unspecified injury of lower back, initial encounter: Secondary | ICD-10-CM | POA: Diagnosis not present

## 2015-10-02 DIAGNOSIS — S300XXA Contusion of lower back and pelvis, initial encounter: Principal | ICD-10-CM | POA: Insufficient documentation

## 2015-10-02 DIAGNOSIS — Z885 Allergy status to narcotic agent status: Secondary | ICD-10-CM | POA: Insufficient documentation

## 2015-10-02 DIAGNOSIS — M25559 Pain in unspecified hip: Secondary | ICD-10-CM | POA: Diagnosis not present

## 2015-10-02 DIAGNOSIS — S199XXA Unspecified injury of neck, initial encounter: Secondary | ICD-10-CM | POA: Diagnosis not present

## 2015-10-02 DIAGNOSIS — S0083XA Contusion of other part of head, initial encounter: Secondary | ICD-10-CM | POA: Insufficient documentation

## 2015-10-02 DIAGNOSIS — M8580 Other specified disorders of bone density and structure, unspecified site: Secondary | ICD-10-CM | POA: Insufficient documentation

## 2015-10-02 DIAGNOSIS — M542 Cervicalgia: Secondary | ICD-10-CM | POA: Insufficient documentation

## 2015-10-02 DIAGNOSIS — I10 Essential (primary) hypertension: Secondary | ICD-10-CM | POA: Diagnosis not present

## 2015-10-02 DIAGNOSIS — S3993XA Unspecified injury of pelvis, initial encounter: Secondary | ICD-10-CM | POA: Diagnosis not present

## 2015-10-02 DIAGNOSIS — M25551 Pain in right hip: Secondary | ICD-10-CM | POA: Diagnosis not present

## 2015-10-02 DIAGNOSIS — T148 Other injury of unspecified body region: Secondary | ICD-10-CM | POA: Diagnosis not present

## 2015-10-02 DIAGNOSIS — R51 Headache: Secondary | ICD-10-CM | POA: Insufficient documentation

## 2015-10-02 DIAGNOSIS — M545 Low back pain: Secondary | ICD-10-CM | POA: Diagnosis not present

## 2015-10-02 HISTORY — DX: Anxiety disorder, unspecified: F41.9

## 2015-10-02 HISTORY — DX: Essential (primary) hypertension: I10

## 2015-10-02 LAB — CBC WITH DIFFERENTIAL/PLATELET
BASOS ABS: 0 10*3/uL (ref 0.0–0.1)
BASOS PCT: 0 %
EOS PCT: 0 %
Eosinophils Absolute: 0 10*3/uL (ref 0.0–0.7)
HEMATOCRIT: 34.4 % — AB (ref 36.0–46.0)
Hemoglobin: 11.4 g/dL — ABNORMAL LOW (ref 12.0–15.0)
LYMPHS PCT: 34 %
Lymphs Abs: 2.3 10*3/uL (ref 0.7–4.0)
MCH: 29.5 pg (ref 26.0–34.0)
MCHC: 33.1 g/dL (ref 30.0–36.0)
MCV: 88.9 fL (ref 78.0–100.0)
MONO ABS: 0.5 10*3/uL (ref 0.1–1.0)
MONOS PCT: 7 %
NEUTROS ABS: 4.1 10*3/uL (ref 1.7–7.7)
Neutrophils Relative %: 59 %
PLATELETS: 247 10*3/uL (ref 150–400)
RBC: 3.87 MIL/uL (ref 3.87–5.11)
RDW: 12.7 % (ref 11.5–15.5)
WBC: 6.9 10*3/uL (ref 4.0–10.5)

## 2015-10-02 LAB — BASIC METABOLIC PANEL
ANION GAP: 7 (ref 5–15)
BUN: 11 mg/dL (ref 6–20)
CALCIUM: 8.7 mg/dL — AB (ref 8.9–10.3)
CO2: 22 mmol/L (ref 22–32)
Chloride: 110 mmol/L (ref 101–111)
Creatinine, Ser: 0.91 mg/dL (ref 0.44–1.00)
GFR calc Af Amer: >60 mL/min (ref >60)
GLUCOSE: 107 mg/dL — AB (ref 65–99)
Potassium: 3.8 mmol/L (ref 3.5–5.1)
Sodium: 139 mmol/L (ref 135–145)

## 2015-10-02 LAB — URINALYSIS, ROUTINE W REFLEX MICROSCOPIC
BILIRUBIN URINE: NEGATIVE
GLUCOSE, UA: NEGATIVE mg/dL
KETONES UR: NEGATIVE mg/dL
Leukocytes, UA: NEGATIVE
Nitrite: NEGATIVE
PH: 8 (ref 5.0–8.0)
PROTEIN: NEGATIVE mg/dL
Specific Gravity, Urine: 1.009 (ref 1.005–1.030)

## 2015-10-02 LAB — URINE MICROSCOPIC-ADD ON

## 2015-10-02 MED ORDER — GABAPENTIN 300 MG PO CAPS: 300.0000 mg | ORAL_CAPSULE | Freq: Every evening | ORAL | Status: DC

## 2015-10-02 MED ORDER — GABAPENTIN 300 MG PO CAPS
300.0000 mg | ORAL_CAPSULE | Freq: Every evening | ORAL | Status: DC
  Filled 2015-10-02 (×2): qty 1

## 2015-10-02 MED ORDER — TRAZODONE HCL 50 MG PO TABS
50.0000 mg | ORAL_TABLET | Freq: Every day | ORAL | Status: DC
  Administered 2015-10-02: 50 mg via ORAL
  Filled 2015-10-02 (×2): qty 1

## 2015-10-02 MED ORDER — METOCLOPRAMIDE HCL 5 MG/ML IJ SOLN
10.0000 mg | Freq: Once | INTRAMUSCULAR | Status: AC
  Administered 2015-10-02: 10 mg via INTRAVENOUS
  Filled 2015-10-02: qty 2

## 2015-10-02 MED ORDER — PANTOPRAZOLE SODIUM 40 MG PO TBEC
40.0000 mg | DELAYED_RELEASE_TABLET | Freq: Every day | ORAL | Status: DC
  Administered 2015-10-02 – 2015-10-03 (×2): 40 mg via ORAL
  Filled 2015-10-02 (×2): qty 1

## 2015-10-02 MED ORDER — SODIUM CHLORIDE 0.9 % IV SOLN
INTRAVENOUS | Status: DC
  Administered 2015-10-02: 20:00:00 via INTRAVENOUS

## 2015-10-02 MED ORDER — METOPROLOL SUCCINATE ER 25 MG PO TB24
25.0000 mg | ORAL_TABLET | Freq: Every day | ORAL | Status: DC
  Administered 2015-10-02 – 2015-10-03 (×2): 25 mg via ORAL
  Filled 2015-10-02 (×2): qty 1

## 2015-10-02 MED ORDER — HYDROMORPHONE HCL 1 MG/ML IJ SOLN
0.5000 mg | INTRAMUSCULAR | Status: DC | PRN
  Administered 2015-10-02 – 2015-10-03 (×2): 0.5 mg via INTRAVENOUS
  Filled 2015-10-02 (×3): qty 1

## 2015-10-02 MED ORDER — SODIUM CHLORIDE 0.9 % IV BOLUS (SEPSIS)
500.0000 mL | Freq: Once | INTRAVENOUS | Status: AC
  Administered 2015-10-02: 500 mL via INTRAVENOUS

## 2015-10-02 MED ORDER — ZOLPIDEM TARTRATE 5 MG PO TABS: 5.0000 mg | ORAL_TABLET | Freq: Every evening | ORAL | Status: DC | PRN

## 2015-10-02 MED ORDER — ALPRAZOLAM 0.5 MG PO TABS: 0.5000 mg | ORAL_TABLET | Freq: Two times a day (BID) | ORAL | Status: DC | PRN

## 2015-10-02 MED ORDER — CARISOPRODOL 350 MG PO TABS
350.0000 mg | ORAL_TABLET | Freq: Two times a day (BID) | ORAL | Status: DC
  Administered 2015-10-02 – 2015-10-03 (×2): 350 mg via ORAL
  Filled 2015-10-02 (×2): qty 1

## 2015-10-02 MED ORDER — ONDANSETRON HCL 4 MG/2ML IJ SOLN
4.0000 mg | Freq: Four times a day (QID) | INTRAMUSCULAR | Status: DC | PRN
  Administered 2015-10-02: 4 mg via INTRAVENOUS
  Filled 2015-10-02: qty 2

## 2015-10-02 MED ORDER — BACLOFEN 20 MG PO TABS
20.0000 mg | ORAL_TABLET | Freq: Three times a day (TID) | ORAL | Status: DC
  Administered 2015-10-02 – 2015-10-03 (×2): 20 mg via ORAL
  Filled 2015-10-02 (×4): qty 1

## 2015-10-02 MED ORDER — PANTOPRAZOLE SODIUM 40 MG PO TBEC: 40.0000 mg | DELAYED_RELEASE_TABLET | Freq: Every day | ORAL | Status: DC

## 2015-10-02 MED ORDER — MIRTAZAPINE 7.5 MG PO TABS
7.5000 mg | ORAL_TABLET | Freq: Every day | ORAL | Status: DC
  Administered 2015-10-02: 7.5 mg via ORAL
  Filled 2015-10-02 (×2): qty 1

## 2015-10-02 MED ORDER — CITALOPRAM HYDROBROMIDE 10 MG PO TABS
10.0000 mg | ORAL_TABLET | Freq: Every day | ORAL | Status: DC
  Administered 2015-10-02 – 2015-10-03 (×2): 10 mg via ORAL
  Filled 2015-10-02 (×2): qty 1

## 2015-10-02 MED ORDER — HYDROMORPHONE HCL 1 MG/ML IJ SOLN
0.5000 mg | Freq: Once | INTRAMUSCULAR | Status: AC
  Administered 2015-10-02: 0.5 mg via INTRAVENOUS
  Filled 2015-10-02: qty 1

## 2015-10-02 MED ORDER — ROSUVASTATIN CALCIUM 10 MG PO TABS
10.0000 mg | ORAL_TABLET | Freq: Every day | ORAL | Status: DC
  Administered 2015-10-02 – 2015-10-03 (×2): 10 mg via ORAL
  Filled 2015-10-02 (×2): qty 1

## 2015-10-02 MED ORDER — ONDANSETRON HCL 4 MG/2ML IJ SOLN
4.0000 mg | Freq: Once | INTRAMUSCULAR | Status: AC
  Administered 2015-10-02: 4 mg via INTRAVENOUS
  Filled 2015-10-02: qty 2

## 2015-10-02 NOTE — H&P (Signed)
History and Physical  Brooke [name] BTD:[id number] DOB: [birth year] DOA: 10/02/2015  PCP: Maximino Greenland, MD   Chief Complaint: fall  History of Present Illness:  Patient is a 75 yo female with history of GERD, fibromylagia, OA who came with cc of fall last night in the bathtub. She said she tripped and did not have any dizziness, vertigo or weakness preceding the fall. She denied any chest pain, dyspnea, cough/ fever/chills, N/V/D/C/abd pain/dysuria. She said she has bad severe pain in her right buttock since last night and she could not tolerate it so she came to the ED.  Review of Systems:  CONSTITUTIONAL:  No night sweats.  No fatigue, malaise, lethargy.  No fever or chills. Eyes:  No visual changes.  No eye pain.  No eye discharge.   ENT:    No epistaxis.  No sinus pain.  No sore throat.  No ear pain.  No congestion. RESPIRATORY:  No cough.  No wheeze.  No hemoptysis.  No shortness of breath. CARDIOVASCULAR:  No chest pains.  No palpitations. GASTROINTESTINAL:  No abdominal pain.  No nausea or vomiting.  No diarrhea or constipation.  No hematemesis.  No hematochezia.  No melena. GENITOURINARY:  No urgency.  No frequency.  No dysuria.  No hematuria.  No obstructive symptoms.  No discharge.  No pain.  No significant abnormal bleeding. MUSCULOSKELETAL:  +musculoskeletal pain.  No joint swelling.  No arthritis. NEUROLOGICAL:  No confusion.  No weakness. No headache. No seizure. PSYCHIATRIC:  No depression. No anxiety. No suicidal ideation. SKIN:  No rashes.  No lesions.  No wounds. ENDOCRINE:  No unexplained weight loss.  No polydipsia.  No polyuria.  No polyphagia. HEMATOLOGIC:  No anemia.  No purpura.  No petechiae.  No bleeding.  ALLERGIC AND IMMUNOLOGIC:  No pruritus.  No swelling Other:  Past Medical and Surgical History:   Past Medical History  Diagnosis Date  . Hypertension   . Anxiety    Past Surgical History  Procedure Laterality Date  . Bunionectomy Left  07/21/2009    Left foot  . Capsulotomy Right 07/21/2009    Rt #2 MPJ  . Tenotomy / flexor tendon transfer Right 07/21/2009    Right foot  . Hammer toe repair Right 07/21/2009    Rt #2  . Osteotomy Right 02/03/2015    Rt 5th met  . Tarsal exostectomy Right 02/03/2015    Social History:   reports that she has never smoked. She has never used smokeless tobacco. Her alcohol and drug histories are not on file.   Allergies  Allergen Reactions  . Codeine Nausea And Vomiting  . Penicillins Nausea And Vomiting    Has patient had a PCN reaction causing immediate rash, facial/tongue/throat swelling, SOB or lightheadedness with hypotension: No Has patient had a PCN reaction causing severe rash involving mucus membranes or skin necrosis: No Has patient had a PCN reaction that required hospitalization No Has patient had a PCN reaction occurring within the last 10 years: No If all of the above answers are "NO", then may proceed with Cephalosporin use.   . Tramadol Nausea And Vomiting    History reviewed. No pertinent family history.    Prior to Admission medications   Medication Sig Start Date End Date Taking? Authorizing Provider  alendronate (FOSAMAX) 70 MG tablet TAKE 1 TABLET EVERY WEEK IN THE MORNING AT LEAST 30 MINUTES BEFORE 1ST FOOD/BEVERAGE OF THE DAY 11/30/14  Yes Historical Provider, MD  ALPRAZolam Duanne Moron) 0.25 MG tablet take  1 tablet by oral route 2 times every day as needed 12/29/14  Yes Historical Provider, MD  baclofen (LIORESAL) 20 MG tablet Take 20 mg by mouth 3 (three) times daily.   Yes Historical Provider, MD  carisoprodol (SOMA) 350 MG tablet take 386m twice daily 12/29/14 12/30/15 Yes Historical Provider, MD  citalopram (CELEXA) 10 MG tablet Take 10 mg by mouth daily.   Yes Historical Provider, MD  esomeprazole (NEXIUM) 40 MG capsule Take 40 mg by mouth daily at 12 noon.   Yes Historical Provider, MD  fluticasone (FLONASE) 50 MCG/ACT nasal spray INHALE 1 SPRAY DAILY IN EACH  NOSTRIL 11/22/14  Yes Historical Provider, MD  gabapentin (NEURONTIN) 300 MG capsule Take 300 mg by mouth every evening.    Yes Historical Provider, MD  metoprolol succinate (TOPROL-XL) 25 MG 24 hr tablet Take 25 mg by mouth.   Yes Historical Provider, MD  mirtazapine (REMERON) 7.5 MG tablet take 7.52mat bedtime may take a second dose in 2 hours if not effective 12/29/14  Yes Historical Provider, MD  rivaroxaban (XARELTO) 20 MG TABS tablet Take 20 mg by mouth daily with supper.   Yes Historical Provider, MD  rosuvastatin (CRESTOR) 10 MG tablet Take 10 mg by mouth.   Yes Historical Provider, MD  traMADol (ULTRAM) 50 MG tablet Take 50 mg by mouth every 6 (six) hours as needed for moderate pain.    Yes Historical Provider, MD  traZODone (DESYREL) 50 MG tablet Take 50 mg by mouth at bedtime.   Yes Historical Provider, MD  Vitamin D, Ergocalciferol, (DRISDOL) 50000 UNITS CAPS capsule TAKE 1 CAPSULE 2 TIMES EVERY WEEK 11/30/14  Yes Historical Provider, MD  aspirin 81 MG chewable tablet Chew 81 mg by mouth.    Historical Provider, MD  chlorhexidine (PERIDEX) 0.12 % solution RINSE 1/2 OZ. TWICE A DAY AFTER BREAKFAST AND BEFORE BEDTIME 01/21/15   Historical Provider, MD  oxyCODONE-acetaminophen (PERCOCET/ROXICET) 5-325 MG per tablet  02/03/15   Historical Provider, MD  trimethobenzamide (TIGAN) 300 MG capsule TAKE 1 CAPSULE BY MOUTH EVERY 6 HOURS AS NEEDED FOR NAUSEA 02/04/15   Historical Provider, MD  zolpidem (AMBIEN CR) 12.5 MG CR tablet Take 12.5 mg by mouth.    Historical Provider, MD    Physical Exam: BP 123/72 mmHg  Pulse 78  Temp(Src) 97.7 F (36.5 C) (Oral)  Resp 18  SpO2 100%  GENERAL :  appears to be in mild acute distress. HEAD: normocephalic. EYES: PERRL, EOMI.  NOSE: No nasal discharge. THROAT: Oral cavity and pharynx normal NECK: Neck supple CARDIAC: Normal S1 and S2. No S3, S4 or murmurs. Rhythm is regular. There is no peripheral edema. LUNGS: Clear to auscultation. ABDOMEN: Positive  bowel sounds. Soft, nondistended, nontender.  MUSKULOSKELETAL: severe pain by palpation near right ischium, induration due to hematoma.  EXTREMITIES: No significant deformity or joint abnormality.  NEUROLOGICAL: The mental examination revealed the patient was oriented to person, place, and time.CN II-XII intact.  PSYCHIATRIC:  The patient was able to demonstrate good judgement and reason, without hallucinations, abnormal affect or abnormal behaviors during the examination. Patient is not suicidal.          Labs on Admission:  Reviewed.   Radiological Exams on Admission: Dg Lumbar Spine Complete  10/02/2015  CLINICAL DATA:  Acute lower back pain after fall last night at home. EXAM: LUMBAR SPINE - COMPLETE 4+ VIEW COMPARISON:  CT scan of July 06, 2008. FINDINGS: Status post surgical posterior fusion of L2, L3, L4 and L5  with bilateral intrapedicular screw placement and interbody fusion. Diffuse osteopenia is noted. No fracture or spondylolisthesis is noted. Atherosclerosis of abdominal aorta is noted. IMPRESSION: Postsurgical changes as described above. No acute abnormality seen in the lumbar spine. Electronically Signed   By: Marijo Conception, M.D.   On: 10/02/2015 14:54   Ct Head Wo Contrast  10/02/2015  CLINICAL DATA:  Fall EXAM: CT HEAD WITHOUT CONTRAST CT CERVICAL SPINE WITHOUT CONTRAST TECHNIQUE: Multidetector CT imaging of the head and cervical spine was performed following the standard protocol without intravenous contrast. Multiplanar CT image reconstructions of the cervical spine were also generated. COMPARISON:  09/11/2015 FINDINGS: CT HEAD FINDINGS Normal ventricular morphology. No midline shift or mass effect. Normal appearance of brain parenchyma. No intracranial hemorrhage, mass lesion or evidence acute infarction. No extra-axial fluid collections. Atherosclerotic calcifications at the carotid siphons. Bones demineralized but intact. Visualized paranasal sinuses and mastoid air cells  clear. CT CERVICAL SPINE FINDINGS Scattered beam hardening artifacts of dental origin. Scattered atherosclerotic calcifications. Lung apices clear. Diffuse osseous demineralization. Prevertebral soft tissues normal thickness. Disc space narrowing and scattered endplate spur formation C4-C5 through C6-C7. Vertebral body heights maintained without fracture or subluxation. Multilevel facet degenerative changes cervical spine. Ankylosis of LEFT C2-C3 facet joint. IMPRESSION: No acute intracranial abnormalities. 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 No acute cervical spine abnormalities. Electronically Signed   By: Lavonia Dana M.D.   On: 10/02/2015 14:54   Ct Cervical Spine Wo Contrast  10/02/2015  CLINICAL DATA:  Fall EXAM: CT HEAD WITHOUT CONTRAST CT CERVICAL SPINE WITHOUT CONTRAST TECHNIQUE: Multidetector CT imaging of the head and cervical spine was performed following the standard protocol without intravenous contrast. Multiplanar CT image reconstructions of the cervical spine were also generated. COMPARISON:  09/11/2015 FINDINGS: CT HEAD FINDINGS Normal ventricular morphology. No midline shift or mass effect. Normal appearance of brain parenchyma. No intracranial hemorrhage, mass lesion or evidence acute infarction. No extra-axial fluid collections. Atherosclerotic calcifications at the carotid siphons. Bones demineralized but intact. Visualized paranasal sinuses and mastoid air cells clear. CT CERVICAL SPINE FINDINGS Scattered beam hardening artifacts of dental origin. Scattered atherosclerotic calcifications. Lung apices clear. Diffuse osseous demineralization. Prevertebral soft tissues normal thickness. Disc space narrowing and scattered endplate spur formation C4-C5 through C6-C7. Vertebral body heights maintained without fracture or subluxation. Multilevel facet degenerative changes cervical spine. Ankylosis of LEFT C2-C3 facet joint. IMPRESSION: No acute intracranial abnormalities.  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 No acute cervical spine abnormalities. Electronically Signed   By: Lavonia Dana M.D.   On: 10/02/2015 14:54   Ct Pelvis Wo Contrast  10/02/2015  CLINICAL DATA:  Fall last night with right leg pain and hip pain. EXAM: CT PELVIS WITHOUT CONTRAST TECHNIQUE: Multidetector CT imaging of the pelvis was performed following the standard protocol without intravenous contrast. COMPARISON:  CT 07/06/2008 and MRI spine 03/16/2010 FINDINGS: There are mild symmetric degenerative changes of the hips. There is no acute fracture or dislocation. There are minimal degenerate changes of the sacroiliac joints. There are moderate degenerate changes of the spine. Posterior fusion hardware is intact partially visualized over the L4-5 level. Prosthetic disc material at the L4-5 and L5-S1 levels. Mild diverticulosis of the colon. Multiple pelvic phleboliths. Prior hysterectomy. Over the medial right inferior gluteal region adjacent the midline and just posterior and inferior to the level of the right ischial tuberosity is an oval somewhat ill-defined hyperdense collection within the subcutaneous fat measuring 4.1 x 6.1 cm compatible with a hematoma.  Mild edema/hemorrhage in the adjacent subcutaneous fat over the inferior gluteal regions bilaterally. Underlying bony structures are within normal without acute fracture. IMPRESSION: Hematoma within the subcutaneous fat over the inferior right gluteal region just right of midline inferior and posterior to the level of the ischial tuberosity measuring 4.1 x 6.1 cm. No acute fracture or dislocation. Degenerate change of the spine and hips. Posterior fusion hardware over the lumbar spine. Electronically Signed   By: Marin Olp M.D.   On: 10/02/2015 14:59     Assessment/Plan  R.gluteal subcutaneous hematoma: Due to mechanical fall Ice packs, pain management, consult to gen surgery Dc aspirin   Fibromyalgia: cont home  meds  DVT prophylaxis: [place] enoxaparin, low risk of bleeding. Consultants: Gen surgery  Code Status: Full code     Gennaro Africa M.D [facility]

## 2015-10-02 NOTE — ED Notes (Signed)
Pt fell last night, patient reports LOC, c/o headache, c/o pain to R lower extremity and hip. Pt given Fentanyl and Zofran 4 mg by EMS.

## 2015-10-02 NOTE — Progress Notes (Signed)
Pt declined gabapentin tonight, stating she fell last night after taking it.  She would like to talk to the doctor about it before taking it.

## 2015-10-02 NOTE — ED Notes (Signed)
[facility] Expected date: 10/02/15 Expected time: 1:37 PM Means of arrival: Ambulance Comments: Hip deformity

## 2015-10-02 NOTE — Progress Notes (Signed)
Lance CoonLaura Harduk, PA recently made aware of pt arrival to unit. See new additional orders received.

## 2015-10-02 NOTE — ED Provider Notes (Signed)
CSN: [id number]     Arrival date & time 10/02/15  1330 History   First MD Initiated Contact with Patient 10/02/15 1337     Chief Complaint  Patient presents with  . Hip Pain     (Consider location/radiation/quality/duration/timing/severity/associated sxs/prior Treatment) HPI Comments: Patient presents with pelvic pain after a fall. She states she was walking to the bathroom and fell The toilet. She fell straight back onto her buttocks area and hit her head. There is no loss of consciousness. She doesn't know what caused her to fall. She states she takes a lot of medicines and make her drowsy and she feels like it might of the knot. She otherwise has been feeling fine without recent illnesses. No chest pain palpitations or shortness of breath. No recent fevers or URI symptoms. She complains of significant pain to her pelvic area. She did hit her head and complains of pain in her neck and low back. She denies any numbness or weakness to her extremities. She does complain of a headache. She was given fentanyl 100 g and Zofran prior to arrival by EMS.  Patient is a 75 y.o. female presenting with hip pain.  Hip Pain Associated symptoms include headaches. Pertinent negatives include no chest pain, no abdominal pain and no shortness of breath.    Past Medical History  Diagnosis Date  . Hypertension   . Anxiety    Past Surgical History  Procedure Laterality Date  . Bunionectomy Left 07/21/2009    Left foot  . Capsulotomy Right 07/21/2009    Rt #2 MPJ  . Tenotomy / flexor tendon transfer Right 07/21/2009    Right foot  . Hammer toe repair Right 07/21/2009    Rt #2  . Osteotomy Right 02/03/2015    Rt 5th met  . Tarsal exostectomy Right 02/03/2015   History reviewed. No pertinent family history. Social History  Substance Use Topics  . Smoking status: Never Smoker   . Smokeless tobacco: Never Used  . Alcohol Use: None   OB History    No data available     Review of Systems   Constitutional: Negative for fever, chills, diaphoresis and fatigue.  HENT: Negative for congestion, rhinorrhea and sneezing.   Eyes: Negative.   Respiratory: Negative for cough, chest tightness and shortness of breath.   Cardiovascular: Negative for chest pain and leg swelling.  Gastrointestinal: Negative for nausea, vomiting, abdominal pain, diarrhea and blood in stool.  Genitourinary: Negative for frequency, hematuria, flank pain and difficulty urinating.  Musculoskeletal: Positive for back pain, arthralgias and neck pain.  Skin: Negative for rash.  Neurological: Positive for headaches. Negative for dizziness, speech difficulty, weakness, light-headedness and numbness.      Allergies  Codeine and Penicillins  Home Medications   Prior to Admission medications   Medication Sig Start Date End Date Taking? Authorizing Provider  alendronate (FOSAMAX) 70 MG tablet TAKE 1 TABLET EVERY WEEK IN THE MORNING AT LEAST 30 MINUTES BEFORE 1ST FOOD/BEVERAGE OF THE DAY 11/30/14   Historical Provider, MD  ALPRAZolam (XANAX) 0.25 MG tablet take 1 tablet by oral route 2 times every day as needed 12/29/14   Historical Provider, MD  aspirin 81 MG chewable tablet Chew 81 mg by mouth.    Historical Provider, MD  carisoprodol (SOMA) 350 MG tablet 1 po BID 12/29/14 12/30/15  Historical Provider, MD  chlorhexidine (PERIDEX) 0.12 % solution RINSE 1/2 OZ. TWICE A DAY AFTER BREAKFAST AND BEFORE BEDTIME 01/21/15   Historical Provider, MD  esomeprazole (NEXIUM)   10 MG packet Take 10 mg by mouth.    Historical Provider, MD  fluticasone (FLONASE) 50 MCG/ACT nasal spray INHALE 1 SPRAY DAILY IN [facility] NOSTRIL 11/22/14   Historical Provider, MD  gabapentin (NEURONTIN) 300 MG capsule Take 300 mg by mouth.    Historical Provider, MD  metoprolol succinate (TOPROL-XL) 25 MG 24 hr tablet Take 25 mg by mouth.    Historical Provider, MD  mirtazapine (REMERON) 7.5 MG tablet take 1 tablet by oral route  every day at bedtime. D/c  rx  for ambien 12/29/14   Historical Provider, MD  NEXIUM 40 MG capsule Take 40 mg by mouth daily. 09/27/14   Historical Provider, MD  oxyCODONE-acetaminophen (PERCOCET/ROXICET) 5-325 MG per tablet  02/03/15   Historical Provider, MD  rosuvastatin (CRESTOR) 10 MG tablet Take 10 mg by mouth.    Historical Provider, MD  traMADol (ULTRAM) 50 MG tablet Take 50 mg by mouth.    Historical Provider, MD  trimethobenzamide (TIGAN) 300 MG capsule TAKE 1 CAPSULE BY MOUTH EVERY 6 HOURS AS NEEDED FOR NAUSEA 02/04/15   Historical Provider, MD  Vitamin D, Ergocalciferol, (DRISDOL) 50000 UNITS CAPS capsule TAKE 1 CAPSULE 2 TIMES EVERY WEEK 11/30/14   Historical Provider, MD  zolpidem (AMBIEN CR) 12.5 MG CR tablet Take 12.5 mg by mouth.    Historical Provider, MD   SpO2 98% Physical Exam  Constitutional: She is oriented to person, place, and time. She appears well-developed and well-nourished. She appears distressed (Appears in pain).  HENT:  Head: Normocephalic and atraumatic.  Eyes: Pupils are equal, round, and reactive to light.  Neck:  Pain throughout the cervical spine.  Cardiovascular: Normal rate, regular rhythm and normal heart sounds.   Pulmonary/Chest: Effort normal and breath sounds normal. No respiratory distress. She has no wheezes. She has no rales. She exhibits no tenderness.  Abdominal: Soft. Bowel sounds are normal. There is no tenderness. There is no rebound and no guarding.  Musculoskeletal: Normal range of motion. She exhibits no edema.  Patient has tenderness on palpation of the pelvic area, primarily on the right. She has a large 6-7 cm hematoma to her right buttocks that extends into the perirectal area. No wounds are noted. There is no specific tenderness on range of motion of the hips. No pain to the knees or ankles. There is no pain to the upper extremities. She has tenderness along the cervical and lumbosacral spine. No pain to the thoracic spine. No step-offs or deformities.  Lymphadenopathy:     She has no cervical adenopathy.  Neurological: She is alert and oriented to person, place, and time. She has normal strength. No cranial nerve deficit or sensory deficit. GCS eye subscore is 4. GCS verbal subscore is 5. GCS motor subscore is 6.  Skin: Skin is warm and dry. No rash noted.  Psychiatric: She has a normal mood and affect.    ED Course  Procedures (including critical care time) Labs Review Labs Reviewed  CBC WITH DIFFERENTIAL/PLATELET - Abnormal; Notable for the following:    Hemoglobin 11.4 (*)    HCT 34.4 (*)    All other components within normal limits  BASIC METABOLIC PANEL  URINALYSIS, ROUTINE W REFLEX MICROSCOPIC (NOT AT [facility])    Imaging Review Dg Lumbar Spine Complete  10/02/2015  CLINICAL DATA:  Acute lower back pain after fall last night at home. EXAM: LUMBAR SPINE - COMPLETE 4+ VIEW COMPARISON:  CT scan of July 06, 2008. FINDINGS: Status post surgical posterior fusion of L2,  L3, L4 and L5 with bilateral intrapedicular screw placement and interbody fusion. Diffuse osteopenia is noted. No fracture or spondylolisthesis is noted. Atherosclerosis of abdominal aorta is noted. IMPRESSION: Postsurgical changes as described above. No acute abnormality seen in the lumbar spine. Electronically Signed   By: Marijo Conception, M.D.   On: 10/02/2015 14:54   Ct Head Wo Contrast  10/02/2015  CLINICAL DATA:  Fall EXAM: CT HEAD WITHOUT CONTRAST CT CERVICAL SPINE WITHOUT CONTRAST TECHNIQUE: Multidetector CT imaging of the head and cervical spine was performed following the standard protocol without intravenous contrast. Multiplanar CT image reconstructions of the cervical spine were also generated. COMPARISON:  09/11/2015 FINDINGS: CT HEAD FINDINGS Normal ventricular morphology. No midline shift or mass effect. Normal appearance of brain parenchyma. No intracranial hemorrhage, mass lesion or evidence acute infarction. No extra-axial fluid collections. Atherosclerotic calcifications at the  carotid siphons. Bones demineralized but intact. Visualized paranasal sinuses and mastoid air cells clear. CT CERVICAL SPINE FINDINGS Scattered beam hardening artifacts of dental origin. Scattered atherosclerotic calcifications. Lung apices clear. Diffuse osseous demineralization. Prevertebral soft tissues normal thickness. Disc space narrowing and scattered endplate spur formation C4-C5 through C6-C7. Vertebral body heights maintained without fracture or subluxation. Multilevel facet degenerative changes cervical spine. Ankylosis of LEFT C2-C3 facet joint. IMPRESSION: No acute intracranial abnormalities. 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 No acute cervical spine abnormalities. Electronically Signed   By: Lavonia Dana M.D.   On: 10/02/2015 14:54   Ct Cervical Spine Wo Contrast  10/02/2015  CLINICAL DATA:  Fall EXAM: CT HEAD WITHOUT CONTRAST CT CERVICAL SPINE WITHOUT CONTRAST TECHNIQUE: Multidetector CT imaging of the head and cervical spine was performed following the standard protocol without intravenous contrast. Multiplanar CT image reconstructions of the cervical spine were also generated. COMPARISON:  09/11/2015 FINDINGS: CT HEAD FINDINGS Normal ventricular morphology. No midline shift or mass effect. Normal appearance of brain parenchyma. No intracranial hemorrhage, mass lesion or evidence acute infarction. No extra-axial fluid collections. Atherosclerotic calcifications at the carotid siphons. Bones demineralized but intact. Visualized paranasal sinuses and mastoid air cells clear. CT CERVICAL SPINE FINDINGS Scattered beam hardening artifacts of dental origin. Scattered atherosclerotic calcifications. Lung apices clear. Diffuse osseous demineralization. Prevertebral soft tissues normal thickness. Disc space narrowing and scattered endplate spur formation C4-C5 through C6-C7. Vertebral body heights maintained without fracture or subluxation. Multilevel facet degenerative changes  cervical spine. Ankylosis of LEFT C2-C3 facet joint. IMPRESSION: No acute intracranial abnormalities. 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 No acute cervical spine abnormalities. Electronically Signed   By: Lavonia Dana M.D.   On: 10/02/2015 14:54   Ct Pelvis Wo Contrast  10/02/2015  CLINICAL DATA:  Fall last night with right leg pain and hip pain. EXAM: CT PELVIS WITHOUT CONTRAST TECHNIQUE: Multidetector CT imaging of the pelvis was performed following the standard protocol without intravenous contrast. COMPARISON:  CT 07/06/2008 and MRI spine 03/16/2010 FINDINGS: There are mild symmetric degenerative changes of the hips. There is no acute fracture or dislocation. There are minimal degenerate changes of the sacroiliac joints. There are moderate degenerate changes of the spine. Posterior fusion hardware is intact partially visualized over the L4-5 level. Prosthetic disc material at the L4-5 and L5-S1 levels. Mild diverticulosis of the colon. Multiple pelvic phleboliths. Prior hysterectomy. Over the medial right inferior gluteal region adjacent the midline and just posterior and inferior to the level of the right ischial tuberosity is an oval somewhat ill-defined hyperdense collection within the subcutaneous fat measuring 4.1 x 6.1 cm  compatible with a hematoma. Mild edema/hemorrhage in the adjacent subcutaneous fat over the inferior gluteal regions bilaterally. Underlying bony structures are within normal without acute fracture. IMPRESSION: Hematoma within the subcutaneous fat over the inferior right gluteal region just right of midline inferior and posterior to the level of the ischial tuberosity measuring 4.1 x 6.1 cm. No acute fracture or dislocation. Degenerate change of the spine and hips. Posterior fusion hardware over the lumbar spine. Electronically Signed   By: Daniel  Boyle M.D.   On: 10/02/2015 14:59   I have personally reviewed and evaluated these images and lab results as part  of my medical decision-making.   EKG Interpretation   Date/Time:  Sunday October 02 2015 14:38:04 EDT Ventricular Rate:  81 PR Interval:  148 QRS Duration: 87 QT Interval:  390 QTC Calculation: 453 R Axis:   20 Text Interpretation:  Sinus rhythm Abnormal R-wave progression, early  transition since last tracing no significant change Confirmed by    MD,  ([id number]) on 10/02/2015 3:11:15 PM      MDM   Final diagnoses:  None    Patient presents after a fall. It's unclear what led to the fall. She had no preceding dizziness chest pain palpitations or shortness of breath. She feels it might be related to her medications which make her drowsy. There is no evidence of intracranial hemorrhage. No evidence of spinal fractures. She's neurologically intact. There is no evidence of pelvic or hip fractures. She does have a large hematoma to her with region. She is in a large amount discomfort and has ongoing vomiting despite treatment in the ED. I feel that it would be prudent for admission for pain control and physical therapy. I will consult the hospitalist for admission.  Discussed with Dr. Hamad who will admit the pt.   , MD 10/08/15 0656 

## 2015-10-02 NOTE — ED Notes (Signed)
Pt vomiting post meds.

## 2015-10-02 NOTE — ED Notes (Signed)
Pt with hematoma to sacral area. Pt c/o 10/10pain and nausea.

## 2015-10-03 DIAGNOSIS — S300XXA Contusion of lower back and pelvis, initial encounter: Secondary | ICD-10-CM | POA: Diagnosis not present

## 2015-10-03 DIAGNOSIS — S300XXS Contusion of lower back and pelvis, sequela: Secondary | ICD-10-CM | POA: Diagnosis not present

## 2015-10-03 DIAGNOSIS — E785 Hyperlipidemia, unspecified: Secondary | ICD-10-CM

## 2015-10-03 DIAGNOSIS — I4892 Unspecified atrial flutter: Secondary | ICD-10-CM | POA: Diagnosis not present

## 2015-10-03 MED ORDER — ASPIRIN 81 MG PO CHEW: 81.0000 mg | CHEWABLE_TABLET | Freq: Every day | ORAL | Status: DC

## 2015-10-03 MED ORDER — ACETAMINOPHEN ER 650 MG PO TBCR: 650.0000 mg | EXTENDED_RELEASE_TABLET | Freq: Three times a day (TID) | ORAL | Status: DC | PRN

## 2015-10-03 MED ORDER — RIVAROXABAN 20 MG PO TABS: 20.0000 mg | ORAL_TABLET | Freq: Every day | ORAL | Status: DC

## 2015-10-03 NOTE — Discharge Summary (Signed)
Brooke [name], is a 75 y.o. female  DOB [birth year]  MRN [id number].  Admission date:  10/02/2015  Admitting Physician  Gennaro Africa, MD  Discharge Date:  10/03/2015   Primary MD  Maximino Greenland, MD  Recommendations for primary care physician for things to follow:  - Patient instructed to hold aspirin and Plavix 24/12/2015 - Please check CBC during next visit   Admission Diagnosis  Pelvic hematoma, female [N94.89]   Discharge Diagnosis  Pelvic hematoma, female [N94.89]    Active Problems:   Pelvic hematoma, female      Past Medical History  Diagnosis Date  . Hypertension   . Anxiety     Past Surgical History  Procedure Laterality Date  . Bunionectomy Left 07/21/2009    Left foot  . Capsulotomy Right 07/21/2009    Rt #2 MPJ  . Tenotomy / flexor tendon transfer Right 07/21/2009    Right foot  . Hammer toe repair Right 07/21/2009    Rt #2  . Osteotomy Right 02/03/2015    Rt 5th met  . Tarsal exostectomy Right 02/03/2015  . Joint replacement    . Back surgery         History of present illness and  Hospital Course:     Kindly see H&P for history of present illness and admission details, please review complete Labs, Consult reports and Test reports for all details in brief  HPI  from the history and physical done on the day of admission 10/02/2015  Patient is a 75 yo female with history of GERD, fibromylagia, OA who came with cc of fall last night in the bathtub. She said she tripped and did not have any dizziness, vertigo or weakness preceding the fall. She denied any chest pain, dyspnea, cough/ fever/chills, N/V/D/C/abd pain/dysuria. She said she has bad severe pain in her right buttock since last night and she could not tolerate it so she came to the ED.   Hospital Course  Right carotid. Cutaneous hematoma secondary to mechanical fall - Discussed with surgery, no need a to drain, will  resolve spontaneously, is instructed to continue with ice packs, continue Tylenol as needed for pain, hold anticoagulation aspirin and Xarelto till 10/06/2015.  Paroxysmal atrial flutter - Normal sinus rhythm, continue with metoprolol - Discussed with her primary cardiologist Dr. Woody Seller, she is on Xarelto for paroxysmal atrial flutter, and it is okay to hold till 10/06/2015.  Hyperlipidemia - Continue with statin  Depression with anxiety - Continue with home medication  Discharge Condition: stable   Follow UP  Follow-up Information    Follow up with Maximino Greenland, MD. Schedule an appointment as soon as possible for a visit in 1 week.   Specialty:  Internal Medicine   Contact information:   [address] [place] [address] [phone]         Discharge Instructions  and  Discharge Medications         Discharge Instructions    Diet - low sodium heart healthy  Complete by:  As directed      Discharge instructions    Complete by:  As directed   Follow with Primary MD Maximino Greenland, MD in 7 days   Get CBC, CMP, checked  by Primary MD next visit.    Activity: As tolerated with Full fall precautions use walker/cane & assistance as needed   Disposition Home    Diet: Heart Healthy  , with feeding assistance and aspiration precautions.  For Heart failure patients - Check your Weight same time everyday, if you gain over 2 pounds, or you develop in leg swelling, experience more shortness of breath or chest pain, call your Primary MD immediately. Follow Cardiac Low Salt Diet and 1.5 lit/day fluid restriction.   On your next visit with your primary care physician please Get Medicines reviewed and adjusted.   Please request your Prim.MD to go over all Hospital Tests and Procedure/Radiological results at the follow up, please get all Hospital records sent to your Prim MD by signing hospital release before you go home.   If you experience worsening of your  admission symptoms, develop shortness of breath, life threatening emergency, suicidal or homicidal thoughts you must seek medical attention immediately by calling 911 or calling your MD immediately  if symptoms less severe.  You Must read complete instructions/literature along with all the possible adverse reactions/side effects for all the Medicines you take and that have been prescribed to you. Take any new Medicines after you have completely understood and accpet all the possible adverse reactions/side effects.   Do not drive, operating heavy machinery, perform activities at heights, swimming or participation in water activities or provide baby sitting services if your were admitted for syncope or siezures until you have seen by Primary MD or a Neurologist and advised to do so again.  Do not drive when taking Pain medications.    Do not take more than prescribed Pain, Sleep and Anxiety Medications  Special Instructions: If you have smoked or chewed Tobacco  in the last 2 yrs please stop smoking, stop any regular Alcohol  and or any Recreational drug use.  Wear Seat belts while driving.   Please note  You were cared for by a hospitalist during your hospital stay. If you have any questions about your discharge medications or the care you received while you were in the hospital after you are discharged, you can call the unit and asked to speak with the hospitalist on call if the hospitalist that took care of you is not available. Once you are discharged, your primary care physician will handle any further medical issues. Please note that NO REFILLS for any discharge medications will be authorized once you are discharged, as it is imperative that you return to your primary care physician (or establish a relationship with a primary care physician if you do not have one) for your aftercare needs so that they can reassess your need for medications and monitor your lab values.     Increase activity  slowly    Complete by:  As directed             Medication List    TAKE these medications        acetaminophen 650 MG CR tablet  Commonly known as:  TYLENOL  Take 1 tablet (650 mg total) by mouth every 8 (eight) hours as needed for pain.     alendronate 70 MG tablet  Commonly known as:  FOSAMAX  TAKE 1 TABLET EVERY WEEK  IN THE MORNING AT LEAST 30 MINUTES BEFORE 1ST FOOD/BEVERAGE OF THE DAY     aspirin 81 MG chewable tablet  Chew 1 tablet (81 mg total) by mouth daily. Patient resume on 10/06/2015  Start taking on:  10/06/2015     baclofen 20 MG tablet  Commonly known as:  LIORESAL  Take 20 mg by mouth 3 (three) times daily.     carisoprodol 350 MG tablet  Commonly known as:  SOMA  take 356m twice daily     citalopram 10 MG tablet  Commonly known as:  CELEXA  Take 10 mg by mouth daily.     esomeprazole 40 MG capsule  Commonly known as:  NEXIUM  Take 40 mg by mouth daily at 12 noon.     fluticasone 50 MCG/ACT nasal spray  Commonly known as:  FLONASE  INHALE 1 SPRAY DAILY IN EACH NOSTRIL     gabapentin 300 MG capsule  Commonly known as:  NEURONTIN  Take 300 mg by mouth every evening.     metoprolol succinate 25 MG 24 hr tablet  Commonly known as:  TOPROL-XL  Take 25 mg by mouth.     mirtazapine 7.5 MG tablet  Commonly known as:  REMERON  take 7.581mat bedtime may take a second dose in 2 hours if not effective     rivaroxaban 20 MG Tabs tablet  Commonly known as:  XARELTO  Take 1 tablet (20 mg total) by mouth daily with supper. Please resume on 10/06/2015  Start taking on:  10/06/2015     rosuvastatin 10 MG tablet  Commonly known as:  CRESTOR  Take 10 mg by mouth.     traMADol 50 MG tablet  Commonly known as:  ULTRAM  Take 50 mg by mouth every 6 (six) hours as needed for moderate pain.     traZODone 50 MG tablet  Commonly known as:  DESYREL  Take 50 mg by mouth at bedtime.     Vitamin D (Ergocalciferol) 50000 units Caps capsule  Commonly known as:  DRISDOL    TAKE 1 CAPSULE 2 TIMES EVERY WEEK     XANAX 0.25 MG tablet  Generic drug:  ALPRAZolam  take 1 tablet by oral route 2 times every day as needed anxiety     zolpidem 12.5 MG CR tablet  Commonly known as:  AMBIEN CR  Take 12.5 mg by mouth.          Diet and Activity recommendation: See Discharge Instructions above   Consults obtained -  none   Major procedures and Radiology Reports - PLEASE review detailed and final reports for all details, in brief -     Dg Lumbar Spine Complete  10/02/2015  CLINICAL DATA:  Acute lower back pain after fall last night at home. EXAM: LUMBAR SPINE - COMPLETE 4+ VIEW COMPARISON:  CT scan of July 06, 2008. FINDINGS: Status post surgical posterior fusion of L2, L3, L4 and L5 with bilateral intrapedicular screw placement and interbody fusion. Diffuse osteopenia is noted. No fracture or spondylolisthesis is noted. Atherosclerosis of abdominal aorta is noted. IMPRESSION: Postsurgical changes as described above. No acute abnormality seen in the lumbar spine. Electronically Signed   By: JaMarijo ConceptionM.D.   On: 10/02/2015 14:54   Ct Head Wo Contrast  10/02/2015  CLINICAL DATA:  Fall EXAM: CT HEAD WITHOUT CONTRAST CT CERVICAL SPINE WITHOUT CONTRAST TECHNIQUE: Multidetector CT imaging of the head and cervical spine was performed following the standard protocol without  intravenous contrast. Multiplanar CT image reconstructions of the cervical spine were also generated. COMPARISON:  09/11/2015 FINDINGS: CT HEAD FINDINGS Normal ventricular morphology. No midline shift or mass effect. Normal appearance of brain parenchyma. No intracranial hemorrhage, mass lesion or evidence acute infarction. No extra-axial fluid collections. Atherosclerotic calcifications at the carotid siphons. Bones demineralized but intact. Visualized paranasal sinuses and mastoid air cells clear. CT CERVICAL SPINE FINDINGS Scattered beam hardening artifacts of dental origin. Scattered  atherosclerotic calcifications. Lung apices clear. Diffuse osseous demineralization. Prevertebral soft tissues normal thickness. Disc space narrowing and scattered endplate spur formation C4-C5 through C6-C7. Vertebral body heights maintained without fracture or subluxation. Multilevel facet degenerative changes cervical spine. Ankylosis of LEFT C2-C3 facet joint. IMPRESSION: No acute intracranial abnormalities. 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 No acute cervical spine abnormalities. Electronically Signed   By: Lavonia Dana M.D.   On: 10/02/2015 14:54   Ct Head Wo Contrast  09/11/2015  CLINICAL DATA:  Fall at home.  Head injury.  Initial encounter. EXAM: CT HEAD WITHOUT CONTRAST CT CERVICAL SPINE WITHOUT CONTRAST TECHNIQUE: Multidetector CT imaging of the head and cervical spine was performed following the standard protocol without intravenous contrast. Multiplanar CT image reconstructions of the cervical spine were also generated. COMPARISON:  06/09/2008 cervical myelogram FINDINGS: CT HEAD FINDINGS Skull and Sinuses:Left forehead contusion without fracture. No visible hemo sinus. Visualized orbits: Negative. Brain: No evidence of acute infarction, hemorrhage, hydrocephalus, or mass lesion/mass effect. Mild patchy low-density in the cerebral white matter attributed to chronic small vessel disease. Normal cerebral volume. CT CERVICAL SPINE FINDINGS Negative for acute fracture or subluxation. 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 No prevertebral edema. No gross cervical canal hematoma. IMPRESSION: 1. No evidence of intracranial or cervical spine injury. 2. Forehead contusion without fracture. Electronically Signed   By: Monte Fantasia M.D.   On: 09/11/2015 01:53   Ct Cervical Spine Wo Contrast  10/02/2015  CLINICAL DATA:  Fall EXAM: CT HEAD WITHOUT CONTRAST CT CERVICAL SPINE WITHOUT CONTRAST TECHNIQUE: Multidetector CT imaging of the head  and cervical spine was performed following the standard protocol without intravenous contrast. Multiplanar CT image reconstructions of the cervical spine were also generated. COMPARISON:  09/11/2015 FINDINGS: CT HEAD FINDINGS Normal ventricular morphology. No midline shift or mass effect. Normal appearance of brain parenchyma. No intracranial hemorrhage, mass lesion or evidence acute infarction. No extra-axial fluid collections. Atherosclerotic calcifications at the carotid siphons. Bones demineralized but intact. Visualized paranasal sinuses and mastoid air cells clear. CT CERVICAL SPINE FINDINGS Scattered beam hardening artifacts of dental origin. Scattered atherosclerotic calcifications. Lung apices clear. Diffuse osseous demineralization. Prevertebral soft tissues normal thickness. Disc space narrowing and scattered endplate spur formation C4-C5 through C6-C7. Vertebral body heights maintained without fracture or subluxation. Multilevel facet degenerative changes cervical spine. Ankylosis of LEFT C2-C3 facet joint. IMPRESSION: No acute intracranial abnormalities. 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 No acute cervical spine abnormalities. Electronically Signed   By: Lavonia Dana M.D.   On: 10/02/2015 14:54   Ct Cervical Spine Wo Contrast  09/11/2015  CLINICAL DATA:  Fall at home.  Head injury.  Initial encounter. EXAM: CT HEAD WITHOUT CONTRAST CT CERVICAL SPINE WITHOUT CONTRAST TECHNIQUE: Multidetector CT imaging of the head and cervical spine was performed following the standard protocol without intravenous contrast. Multiplanar CT image reconstructions of the cervical spine were also generated. COMPARISON:  06/09/2008 cervical myelogram FINDINGS: CT HEAD FINDINGS Skull and Sinuses:Left forehead contusion without fracture. No visible hemo sinus.  Visualized orbits: Negative. Brain: No evidence of acute infarction, hemorrhage, hydrocephalus, or mass lesion/mass effect. Mild patchy  low-density in the cerebral white matter attributed to chronic small vessel disease. Normal cerebral volume. CT CERVICAL SPINE FINDINGS Negative for acute fracture or subluxation. 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 No prevertebral edema. No gross cervical canal hematoma. IMPRESSION: 1. No evidence of intracranial or cervical spine injury. 2. Forehead contusion without fracture. Electronically Signed   By: Monte Fantasia M.D.   On: 09/11/2015 01:53   Ct Pelvis Wo Contrast  10/02/2015  CLINICAL DATA:  Fall last night with right leg pain and hip pain. EXAM: CT PELVIS WITHOUT CONTRAST TECHNIQUE: Multidetector CT imaging of the pelvis was performed following the standard protocol without intravenous contrast. COMPARISON:  CT 07/06/2008 and MRI spine 03/16/2010 FINDINGS: There are mild symmetric degenerative changes of the hips. There is no acute fracture or dislocation. There are minimal degenerate changes of the sacroiliac joints. There are moderate degenerate changes of the spine. Posterior fusion hardware is intact partially visualized over the L4-5 level. Prosthetic disc material at the L4-5 and L5-S1 levels. Mild diverticulosis of the colon. Multiple pelvic phleboliths. Prior hysterectomy. Over the medial right inferior gluteal region adjacent the midline and just posterior and inferior to the level of the right ischial tuberosity is an oval somewhat ill-defined hyperdense collection within the subcutaneous fat measuring 4.1 x 6.1 cm compatible with a hematoma. Mild edema/hemorrhage in the adjacent subcutaneous fat over the inferior gluteal regions bilaterally. Underlying bony structures are within normal without acute fracture. IMPRESSION: Hematoma within the subcutaneous fat over the inferior right gluteal region just right of midline inferior and posterior to the level of the ischial tuberosity measuring 4.1 x 6.1 cm. No acute fracture or dislocation.  Degenerate change of the spine and hips. Posterior fusion hardware over the lumbar spine. Electronically Signed   By: Marin Olp M.D.   On: 10/02/2015 14:59    Micro Results     No results found for this or any previous visit (from the past 240 hour(s)).     Today   Subjective:   Brooke [name] today has no headache,no chest abdominal pain,no new weakness tingling or numbness, feels much better wants to go home today.   Objective:   Blood pressure 141/68, pulse 80, temperature 97.7 F (36.5 C), temperature source Oral, resp. rate 16, height 5' (1.524 m), weight 61.1 kg (134 lb 11.2 oz), SpO2 100 %.   Intake/Output Summary (Last 24 hours) at 10/03/15 1412 Last data filed at 10/03/15 1340  Gross per 24 hour  Intake 284.67 ml  Output   1350 ml  Net -1065.33 ml    Exam Awake Alert, Oriented x 3, No new F.N deficits, Normal affect [place].AT,PERRAL Supple Neck,No JVD, No cervical lymphadenopathy appriciated.  Symmetrical Chest wall movement, Good air movement bilaterally, CTAB RRR,No Gallops,Rubs or new Murmurs, No Parasternal Heave +ve B.Sounds, Abd Soft, Non tender, No organomegaly appriciated, No rebound -guarding or rigidity. No Cyanosis, Clubbing or edema, No new Rash or bruise, right medial pretibial hematoma, minimally tender to palpation.  Data Review   CBC w Diff:  Lab Results  Component Value Date   WBC 6.9 10/02/2015   HGB 11.4* 10/02/2015   HCT 34.4* 10/02/2015   PLT 247 10/02/2015   LYMPHOPCT 34 10/02/2015   MONOPCT 7 10/02/2015   EOSPCT 0 10/02/2015   BASOPCT 0 10/02/2015    CMP:  Lab Results  Component Value Date  NA 139 10/02/2015   K 3.8 10/02/2015   CL 110 10/02/2015   CO2 22 10/02/2015   BUN 11 10/02/2015   CREATININE 0.91 10/02/2015   PROT 6.9 07/07/2007   ALBUMIN 4.1 07/07/2007   BILITOT 0.5 07/07/2007   ALKPHOS 66 07/07/2007   AST 31 07/07/2007   ALT 26 07/07/2007  .   Total Time in preparing paper work, data evaluation and  todays exam - 35 minutes  Cordelro Gautreau M.D on 10/03/2015 at 2:12 PM  [facility]   Office  [phone]

## 2015-10-03 NOTE — Discharge Instructions (Signed)
Follow with Primary MD SANDERS,ROBYN N, MD in 7 days  ° °Get CBC, CMP,  checked  by Primary MD next visit.  ° ° °Activity: As tolerated with Full fall precautions use walker/cane & assistance as needed ° ° °Disposition Home  ° ° °Diet: Heart Healthy  , with feeding assistance and aspiration precautions. ° °For Heart failure patients - Check your Weight same time everyday, if you gain over 2 pounds, or you develop in leg swelling, experience more shortness of breath or chest pain, call your Primary MD immediately. Follow Cardiac Low Salt Diet and 1.5 lit/day fluid restriction. ° ° °On your next visit with your primary care physician please Get Medicines reviewed and adjusted. ° ° °Please request your Prim.MD to go over all Hospital Tests and Procedure/Radiological results at the follow up, please get all Hospital records sent to your Prim MD by signing hospital release before you go home. ° ° °If you experience worsening of your admission symptoms, develop shortness of breath, life threatening emergency, suicidal or homicidal thoughts you must seek medical attention immediately by calling 911 or calling your MD immediately  if symptoms less severe. ° °You Must read complete instructions/literature along with all the possible adverse reactions/side effects for all the Medicines you take and that have been prescribed to you. Take any new Medicines after you have completely understood and accpet all the possible adverse reactions/side effects.  ° °Do not drive, operating heavy machinery, perform activities at heights, swimming or participation in water activities or provide baby sitting services if your were admitted for syncope or siezures until you have seen by Primary MD or a Neurologist and advised to do so again. ° °Do not drive when taking Pain medications.  ° ° °Do not take more than prescribed Pain, Sleep and Anxiety Medications ° °Special Instructions: If you have smoked or chewed Tobacco  in the last 2 yrs  please stop smoking, stop any regular Alcohol  and or any Recreational drug use. ° °Wear Seat belts while driving. ° ° °Please note ° °You were cared for by a hospitalist during your hospital stay. If you have any questions about your discharge medications or the care you received while you were in the hospital after you are discharged, you can call the unit and asked to speak with the hospitalist on call if the hospitalist that took care of you is not available. Once you are discharged, your primary care physician will handle any further medical issues. Please note that NO REFILLS for any discharge medications will be authorized once you are discharged, as it is imperative that you return to your primary care physician (or establish a relationship with a primary care physician if you do not have one) for your aftercare needs so that they can reassess your need for medications and monitor your lab values. ° °

## 2015-10-03 NOTE — Evaluation (Signed)
Physical Therapy Evaluation Patient Details Name: Brooke [name] MRN: [id number] DOB: [birth year] Today's Date: 10/03/2015   History of Present Illness  Patient is a 75 yo female with history of GERD, fibromylagia, OA who came with cc of fall 10/01/15 in the bathtub. She said she tripped and did not have any dizziness, vertigo or weakness preceding the fall. Hematoma within the subcutaneous fat over the inferior right gluteal to ischial tuberosity.  Clinical Impression  The patient is ambulating well, no pain and no balance losses noted. No further  PT needs at this time.    Follow Up Recommendations No PT follow up    Equipment Recommendations  None recommended by PT    Recommendations for Other Services       Precautions / Restrictions Precautions Precautions: Fall      Mobility  Bed Mobility Overal bed mobility: Independent                Transfers Overall transfer level: Independent   Transfers: Sit to/from Stand Sit to Stand: Independent            Ambulation/Gait Ambulation/Gait assistance: Independent Ambulation Distance (Feet): 200 Feet Assistive device: None Gait Pattern/deviations: WFL(Within Functional Limits)     General Gait Details: turns around,no balance losses  Stairs            Wheelchair Mobility    Modified Rankin (Stroke Patients Only)       Balance Overall balance assessment: History of Falls;Needs assistance Sitting-balance support: Feet supported;No upper extremity supported Sitting balance-Leahy Scale: Normal     Standing balance support: During functional activity;No upper extremity supported Standing balance-Leahy Scale: Good                               Pertinent Vitals/Pain Pain Assessment: Faces Faces Pain Scale: Hurts a little bit Pain Location: R buttock Pain Descriptors / Indicators: Discomfort Pain Intervention(s): Ice applied    Home Living Family/patient expects to be discharged to::  Private residence Living Arrangements: Alone Available Help at Discharge: Family Type of Home: House Home Access: Level entry     Home Layout: One level Home Equipment: None      Prior Function Level of Independence: Independent               Hand Dominance        Extremity/Trunk Assessment   Upper Extremity Assessment: Overall WFL for tasks assessed           Lower Extremity Assessment: Overall WFL for tasks assessed      Cervical / Trunk Assessment: Normal  Communication   Communication: No difficulties  Cognition Arousal/Alertness: Awake/alert Behavior During Therapy: WFL for tasks assessed/performed Overall Cognitive Status: Impaired/Different from baseline Area of Impairment: Orientation Orientation Level: Time   Memory: Decreased short-term memory         General Comments: was uncertain of medications/ date    General Comments      Exercises        Assessment/Plan    PT Assessment Patent does not need any further PT services  PT Diagnosis     PT Problem List    PT Treatment Interventions     PT Goals (Current goals can be found in the Care Plan section) Acute Rehab PT Goals Patient Stated Goal: to go home PT Goal Formulation: All assessment and education complete, DC therapy    Frequency     Barriers to  discharge        Co-evaluation               End of Session   Activity Tolerance: Patient tolerated treatment well Patient left: in bed;with call bell/phone within reach;with nursing/sitter in room Nurse Communication: Mobility status    Functional Assessment Tool Used: clinical judgement Functional Limitation: Mobility: Walking and moving around Mobility: Walking and Moving Around Current Status [phone]): 0 percent impaired, limited or restricted Mobility: Walking and Moving Around Goal Status [phone]): 0 percent impaired, limited or restricted Mobility: Walking and Moving Around Discharge Status [phone]): 0 percent  impaired, limited or restricted    Time: 0920-0941 PT Time Calculation (min) (ACUTE ONLY): 21 min   Charges:   PT Evaluation $PT Eval Low Complexity: 1 Procedure     PT G Codes:   PT G-Codes **NOT FOR INPATIENT CLASS** Functional Assessment Tool Used: clinical judgement Functional Limitation: Mobility: Walking and moving around Mobility: Walking and Moving Around Current Status ([id number]): 0 percent impaired, limited or restricted Mobility: Walking and Moving Around Goal Status ([id number]): 0 percent impaired, limited or restricted Mobility: Walking and Moving Around Discharge Status [phone]): 0 percent impaired, limited or restricted    Rada Hay 10/03/2015, 10:30 AM Blanchard Kelch PT [phone]

## 2015-10-03 NOTE — Care Management Obs Status (Signed)
MEDICARE OBSERVATION STATUS NOTIFICATION   Patient Details  Name: Brooke [name] MRN: [id number] Date of Birth: 1941-03-18   Medicare Observation Status Notification Given:  Yes    Alexis Goodelleele, Angel Hobdy K, RN 10/03/2015, 10:42 AM

## 2015-10-08 ENCOUNTER — Emergency Department (HOSPITAL_COMMUNITY)
Admission: EM | Admit: 2015-10-08 | Discharge: 2015-10-08 | Disposition: A | Discharge status: Home / Self Care | Payer: Medicare Other | Attending: Emergency Medicine | Admitting: Emergency Medicine

## 2015-10-08 ENCOUNTER — Emergency Department (HOSPITAL_COMMUNITY): Payer: Medicare Other

## 2015-10-08 DIAGNOSIS — E876 Hypokalemia: Secondary | ICD-10-CM | POA: Insufficient documentation

## 2015-10-08 DIAGNOSIS — I10 Essential (primary) hypertension: Secondary | ICD-10-CM | POA: Insufficient documentation

## 2015-10-08 DIAGNOSIS — R531 Weakness: Secondary | ICD-10-CM | POA: Diagnosis not present

## 2015-10-08 DIAGNOSIS — R42 Dizziness and giddiness: Secondary | ICD-10-CM | POA: Diagnosis not present

## 2015-10-08 DIAGNOSIS — Z880 Allergy status to penicillin: Secondary | ICD-10-CM | POA: Diagnosis not present

## 2015-10-08 DIAGNOSIS — R112 Nausea with vomiting, unspecified: Secondary | ICD-10-CM | POA: Diagnosis not present

## 2015-10-08 DIAGNOSIS — F419 Anxiety disorder, unspecified: Secondary | ICD-10-CM | POA: Diagnosis not present

## 2015-10-08 DIAGNOSIS — Z79899 Other long term (current) drug therapy: Secondary | ICD-10-CM | POA: Insufficient documentation

## 2015-10-08 DIAGNOSIS — H6121 Impacted cerumen, right ear: Secondary | ICD-10-CM | POA: Diagnosis not present

## 2015-10-08 DIAGNOSIS — Z7952 Long term (current) use of systemic steroids: Secondary | ICD-10-CM | POA: Insufficient documentation

## 2015-10-08 DIAGNOSIS — R404 Transient alteration of awareness: Secondary | ICD-10-CM | POA: Diagnosis not present

## 2015-10-08 LAB — BASIC METABOLIC PANEL
ANION GAP: 13 (ref 5–15)
BUN: 11 mg/dL (ref 6–20)
CHLORIDE: 108 mmol/L (ref 101–111)
CO2: 23 mmol/L (ref 22–32)
Calcium: 9 mg/dL (ref 8.9–10.3)
Creatinine, Ser: 0.75 mg/dL (ref 0.44–1.00)
GFR calc non Af Amer: >60 mL/min (ref >60)
Glucose, Bld: 89 mg/dL (ref 65–99)
Potassium: 3.1 mmol/L — ABNORMAL LOW (ref 3.5–5.1)
Sodium: 144 mmol/L (ref 135–145)

## 2015-10-08 LAB — CBC WITH DIFFERENTIAL/PLATELET
Basophils Absolute: 0 10*3/uL (ref 0.0–0.1)
Basophils Relative: 1 %
Eosinophils Absolute: 0 10*3/uL (ref 0.0–0.7)
Eosinophils Relative: 0 %
HEMATOCRIT: 36.4 % (ref 36.0–46.0)
HEMOGLOBIN: 12.2 g/dL (ref 12.0–15.0)
LYMPHS ABS: 2.2 10*3/uL (ref 0.7–4.0)
Lymphocytes Relative: 41 %
MCH: 29.3 pg (ref 26.0–34.0)
MCHC: 33.5 g/dL (ref 30.0–36.0)
MCV: 87.5 fL (ref 78.0–100.0)
MONOS PCT: 12 %
Monocytes Absolute: 0.6 10*3/uL (ref 0.1–1.0)
NEUTROS ABS: 2.5 10*3/uL (ref 1.7–7.7)
NEUTROS PCT: 46 %
Platelets: 295 10*3/uL (ref 150–400)
RBC: 4.16 MIL/uL (ref 3.87–5.11)
RDW: 13.2 % (ref 11.5–15.5)
WBC: 5.4 10*3/uL (ref 4.0–10.5)

## 2015-10-08 MED ORDER — DIAZEPAM 5 MG/ML IJ SOLN
5.0000 mg | Freq: Once | INTRAMUSCULAR | Status: AC
  Administered 2015-10-08: 5 mg via INTRAVENOUS
  Filled 2015-10-08: qty 2

## 2015-10-08 MED ORDER — MECLIZINE HCL 25 MG PO TABS: 25.0000 mg | ORAL_TABLET | Freq: Three times a day (TID) | ORAL | Status: DC | PRN

## 2015-10-08 MED ORDER — DIAZEPAM 5 MG/ML IJ SOLN
5.0000 mg | Freq: Once | INTRAMUSCULAR | Status: AC
  Administered 2015-10-08: 5 mg via INTRAVENOUS

## 2015-10-08 MED ORDER — DIAZEPAM 5 MG PO TABS: 5.0000 mg | ORAL_TABLET | Freq: Three times a day (TID) | ORAL | Status: DC | PRN

## 2015-10-08 MED ORDER — DIAZEPAM 2 MG PO TABS
2.0000 mg | ORAL_TABLET | Freq: Once | ORAL | Status: AC
  Administered 2015-10-08: 2 mg via ORAL
  Filled 2015-10-08: qty 1

## 2015-10-08 MED ORDER — POTASSIUM CHLORIDE CRYS ER 20 MEQ PO TBCR
40.0000 meq | EXTENDED_RELEASE_TABLET | Freq: Once | ORAL | Status: AC
  Administered 2015-10-08: 40 meq via ORAL
  Filled 2015-10-08: qty 2

## 2015-10-08 MED ORDER — POTASSIUM CHLORIDE CRYS ER 20 MEQ PO TBCR: 20.0000 meq | EXTENDED_RELEASE_TABLET | Freq: Every day | ORAL | Status: DC

## 2015-10-08 MED ORDER — MECLIZINE HCL 25 MG PO TABS
25.0000 mg | ORAL_TABLET | Freq: Once | ORAL | Status: AC
  Administered 2015-10-08: 25 mg via ORAL
  Filled 2015-10-08: qty 1

## 2015-10-08 NOTE — ED Notes (Signed)
Patient taken back to MRI 

## 2015-10-08 NOTE — Discharge Instructions (Signed)
Benign Positional Vertigo °Vertigo is the feeling that you or your surroundings are moving when they are not. Benign positional vertigo is the most common form of vertigo. The cause of this condition is not serious (is benign). This condition is triggered by certain movements and positions (is positional). This condition can be dangerous if it occurs while you are doing something that could endanger you or others, such as driving.  °CAUSES °In many cases, the cause of this condition is not known. It may be caused by a disturbance in an area of the inner ear that helps your brain to sense movement and balance. This disturbance can be caused by a viral infection (labyrinthitis), head injury, or repetitive motion. °RISK FACTORS °This condition is more likely to develop in: °· Women. °· People who are 50 years of age or older. °SYMPTOMS °Symptoms of this condition usually happen when you move your head or your eyes in different directions. Symptoms may start suddenly, and they usually last for less than a minute. Symptoms may include: °· Loss of balance and falling. °· Feeling like you are spinning or moving. °· Feeling like your surroundings are spinning or moving. °· Nausea and vomiting. °· Blurred vision. °· Dizziness. °· Involuntary eye movement (nystagmus). °Symptoms can be mild and cause only slight annoyance, or they can be severe and interfere with daily life. Episodes of benign positional vertigo may return (recur) over time, and they may be triggered by certain movements. Symptoms may improve over time. °DIAGNOSIS °This condition is usually diagnosed by medical history and a physical exam of the head, neck, and ears. You may be referred to a health care provider who specializes in ear, nose, and throat (ENT) problems (otolaryngologist) or a provider who specializes in disorders of the nervous system (neurologist). You may have additional testing, including: °· MRI. °· A CT scan. °· Eye movement tests. Your  health care provider may ask you to change positions quickly while he or she watches you for symptoms of benign positional vertigo, such as nystagmus. Eye movement may be tested with an electronystagmogram (ENG), caloric stimulation, the Dix-Hallpike test, or the roll test. °· An electroencephalogram (EEG). This records electrical activity in your brain. °· Hearing tests. °TREATMENT °Usually, your health care provider will treat this by moving your head in specific positions to adjust your inner ear back to normal. Surgery may be needed in severe cases, but this is rare. In some cases, benign positional vertigo may resolve on its own in 2-4 weeks. °HOME CARE INSTRUCTIONS °Safety °· Move slowly. Avoid sudden body or head movements. °· Avoid driving. °· Avoid operating heavy machinery. °· Avoid doing any tasks that would be dangerous to you or others if a vertigo episode would occur. °· If you have trouble walking or keeping your balance, try using a cane for stability. If you feel dizzy or unstable, sit down right away. °· Return to your normal activities as told by your health care provider. Ask your health care provider what activities are safe for you. °General Instructions °· Take over-the-counter and prescription medicines only as told by your health care provider. °· Avoid certain positions or movements as told by your health care provider. °· Drink enough fluid to keep your urine clear or pale yellow. °· Keep all follow-up visits as told by your health care provider. This is important. °SEEK MEDICAL CARE IF: °· You have a fever. °· Your condition gets worse or you develop new symptoms. °· Your family or friends   notice any behavioral changes.  Your nausea or vomiting gets worse.  You have numbness or a "pins and needles" sensation. SEEK IMMEDIATE MEDICAL CARE IF:  You have difficulty speaking or moving.  You are always dizzy.  You faint.  You develop severe headaches.  You have weakness in your  legs or arms.  You have changes in your hearing or vision.  You develop a stiff neck.  You develop sensitivity to light.   This information is not intended to replace advice given to you by your health care provider. Make sure you discuss any questions you have with your health care provider.   Document Released: 03/26/2006 Document Revised: 03/09/2015 Document Reviewed: 10/11/2014 Elsevier Interactive Patient Education 2016 [employer] Hypokalemia Hypokalemia means that the amount of potassium in the blood is lower than normal.Potassium is a chemical, called an electrolyte, that helps regulate the amount of fluid in the body. It also stimulates muscle contraction and helps nerves function properly.Most of the body's potassium is inside of cells, and only a very small amount is in the blood. Because the amount in the blood is so small, minor changes can be life-threatening. CAUSES  Antibiotics.  Diarrhea or vomiting.  Using laxatives too much, which can cause diarrhea.  Chronic kidney disease.  Water pills (diuretics).  Eating disorders (bulimia).  Low magnesium level.  Sweating a lot. SIGNS AND SYMPTOMS  Weakness.  Constipation.  Fatigue.  Muscle cramps.  Mental confusion.  Skipped heartbeats or irregular heartbeat (palpitations).  Tingling or numbness. DIAGNOSIS  Your health care provider can diagnose hypokalemia with blood tests. In addition to checking your potassium level, your health care provider may also check other lab tests. TREATMENT Hypokalemia can be treated with potassium supplements taken by mouth or adjustments in your current medicines. If your potassium level is very low, you may need to get potassium through a vein (IV) and be monitored in the hospital. A diet high in potassium is also helpful. Foods high in potassium are:  Nuts, such as peanuts and pistachios.  Seeds, such as sunflower seeds and pumpkin seeds.  Peas, lentils, and lima  beans.  Whole grain and bran cereals and breads.  Fresh fruit and vegetables, such as apricots, avocado, bananas, cantaloupe, kiwi, oranges, tomatoes, asparagus, and potatoes.  Orange and tomato juices.  Red meats.  Fruit yogurt. HOME CARE INSTRUCTIONS  Take all medicines as prescribed by your health care provider.  Maintain a healthy diet by including nutritious food, such as fruits, vegetables, nuts, whole grains, and lean meats.  If you are taking a laxative, be sure to follow the directions on the label. SEEK MEDICAL CARE IF:  Your weakness gets worse.  You feel your heart pounding or racing.  You are vomiting or having diarrhea.  You are diabetic and having trouble keeping your blood glucose in the normal range. SEEK IMMEDIATE MEDICAL CARE IF:  You have chest pain, shortness of breath, or dizziness.  You are vomiting or having diarrhea for more than 2 days.  You faint. MAKE SURE YOU:   Understand these instructions.  Will watch your condition.  Will get help right away if you are not doing well or get worse.   This information is not intended to replace advice given to you by your health care provider. Make sure you discuss any questions you have with your health care provider.   Document Released: 06/18/2005 Document Revised: 07/09/2014 Document Reviewed: 12/19/2012 Elsevier Interactive Patient Education [employer] Elsevier Inc.

## 2015-10-08 NOTE — ED Notes (Signed)
Bed: WA09 Expected date:  Expected time:  Means of arrival:  Comments: EMS [facility] 

## 2015-10-08 NOTE — ED Notes (Signed)
Pt from home via EMS c/o N/V x3 days. Pt reports that she was d/c 4/3 from this facility and has not felt well since. Pt reports N/V with poor po intake. Pt received 8 mg Zofran IV en route with relief. Pt is A&O and in NAD

## 2015-10-08 NOTE — ED Provider Notes (Signed)
CSN: [id number]     Arrival date & time 10/08/15  1523 History   First MD Initiated Contact with Patient 10/08/15 1541     Chief Complaint  Patient presents with  . Nausea  . Emesis     (Consider location/radiation/quality/duration/timing/severity/associated sxs/prior Treatment) HPI Patient discharged from hospital 5 days ago. She was admitted after a fall with an ischial hematoma. EMR reviewed indicates there was no specific cause for the patient's fall. Patient reports she was doing alright after her discharge. She reports she has some buttock pain as expected. 2 days ago however she reports developing severe dizziness. She describes spinning quality. She is also hearing 3 different sounds in her left ear. Clicking, beeping and rushing sounds. She reports severe nausea. Patient has had vomiting. She reports she has had to use the walls and furniture to steady herself when walking due to imbalance. She denies focal, motor weakness numbness or tingling. Patient does not specifically endorse prior history of vertigo. Symptoms are worsened by position change and improved by keeping her head still. She does however describe an old episode of puncture to her left ear drum resulting in some tinnitus problems. Past Medical History  Diagnosis Date  . Hypertension   . Anxiety    Past Surgical History  Procedure Laterality Date  . Bunionectomy Left 07/21/2009    Left foot  . Capsulotomy Right 07/21/2009    Rt #2 MPJ  . Tenotomy / flexor tendon transfer Right 07/21/2009    Right foot  . Hammer toe repair Right 07/21/2009    Rt #2  . Osteotomy Right 02/03/2015    Rt 5th met  . Tarsal exostectomy Right 02/03/2015  . Joint replacement    . Back surgery     No family history on file. Social History  Substance Use Topics  . Smoking status: Never Smoker   . Smokeless tobacco: Never Used  . Alcohol Use: No   OB History    No data available     Review of Systems 10 Systems reviewed and are  negative for acute change except as noted in the HPI.    Allergies  Codeine; Penicillins; and Tramadol  Home Medications   Prior to Admission medications   Medication Sig Start Date End Date Taking? Authorizing Provider  acetaminophen (TYLENOL) 650 MG CR tablet Take 1 tablet (650 mg total) by mouth every 8 (eight) hours as needed for pain. 10/03/15  Yes Silver Huguenin Elgergawy, MD  alendronate (FOSAMAX) 70 MG tablet TAKE 70 MG EVERY WEEK IN THE MORNING AT LEAST 30 MINUTES BEFORE 1ST FOOD/BEVERAGE OF THE DAY 11/30/14  Yes Historical Provider, MD  ALPRAZolam Duanne Moron) 0.25 MG tablet take 0.25 MG by oral route 2 times every day as needed anxiety 12/29/14  Yes Historical Provider, MD  baclofen (LIORESAL) 20 MG tablet Take 20 mg by mouth 3 (three) times daily.   Yes Historical Provider, MD  carisoprodol (SOMA) 350 MG tablet take 342m twice daily 12/29/14 12/30/15 Yes Historical Provider, MD  citalopram (CELEXA) 10 MG tablet Take 10 mg by mouth daily.   Yes Historical Provider, MD  esomeprazole (NEXIUM) 40 MG capsule Take 40 mg by mouth daily at 12 noon.   Yes Historical Provider, MD  fluticasone (FLONASE) 50 MCG/ACT nasal spray INHALE 1 SPRAY DAILY IN EACH NOSTRIL 11/22/14  Yes Historical Provider, MD  gabapentin (NEURONTIN) 300 MG capsule Take 300 mg by mouth every evening.    Yes Historical Provider, MD  metoprolol succinate (TOPROL-XL) 25 MG  24 hr tablet Take 25 mg by mouth daily.    Yes Historical Provider, MD  mirtazapine (REMERON) 7.5 MG tablet take 7.1m at bedtime may take a second dose in 2 hours if not effective 12/29/14  Yes Historical Provider, MD  rosuvastatin (CRESTOR) 10 MG tablet Take 10 mg by mouth.   Yes Historical Provider, MD  traMADol (ULTRAM) 50 MG tablet Take 50 mg by mouth every 6 (six) hours as needed for moderate pain.    Yes Historical Provider, MD  traZODone (DESYREL) 50 MG tablet Take 50 mg by mouth at bedtime.   Yes Historical Provider, MD  Vitamin D, Ergocalciferol, (DRISDOL)  50000 UNITS CAPS capsule TAKE 1 CAPSULE 2 TIMES EVERY WEEK 11/30/14  Yes Historical Provider, MD  zolpidem (AMBIEN CR) 12.5 MG CR tablet Take 12.5 mg by mouth.   Yes Historical Provider, MD  aspirin 81 MG chewable tablet Chew 1 tablet (81 mg total) by mouth daily. Patient resume on 10/06/2015 Patient not taking: Reported on 10/08/2015 10/06/15   DSilver HugueninElgergawy, MD  diazepam (VALIUM) 5 MG tablet Take 1 tablet (5 mg total) by mouth every 8 (eight) hours as needed for anxiety (Dizziness.). 10/08/15   MCharlesetta Shanks MD  meclizine (ANTIVERT) 25 MG tablet Take 1 tablet (25 mg total) by mouth 3 (three) times daily as needed for dizziness. 10/08/15   MCharlesetta Shanks MD  rivaroxaban (XARELTO) 20 MG TABS tablet Take 1 tablet (20 mg total) by mouth daily with supper. Please resume on 10/06/2015 Patient not taking: Reported on 10/08/2015 10/06/15   DSilver HugueninElgergawy, MD   BP 138/70 mmHg  Pulse 69  Temp(Src) 98 F (36.7 C) (Oral)  Resp 18  SpO2 100% Physical Exam  Constitutional: She is oriented to person, place, and time. She appears well-developed and well-nourished. No distress.  HENT:  Head: Normocephalic and atraumatic.  Right Ear: External ear normal.  Left Ear: External ear normal.  Nose: Nose normal.  Mouth/Throat: Oropharynx is clear and moist.  Left TM normal. Right ear canal moderate cerumen impaction. Small crescent of visible trauma is not erythematous.  Eyes: EOM are normal. Pupils are equal, round, and reactive to light.  Neck: Neck supple.  Cardiovascular: Normal rate, regular rhythm, normal heart sounds and intact distal pulses.   Pulmonary/Chest: Effort normal and breath sounds normal.  Abdominal: Soft. Bowel sounds are normal. She exhibits no distension. There is no tenderness.  Musculoskeletal: Normal range of motion. She exhibits tenderness. She exhibits no edema.  Patient has approximately 15 cm, firm, older appearing hematoma to the right ishial region. Bruising is brown and yellow in  appearance. No associated erythema.  Neurological: She is alert and oriented to person, place, and time. She has normal strength. No cranial nerve deficit. She exhibits normal muscle tone. Coordination normal. GCS eye subscore is 4. GCS verbal subscore is 5. GCS motor subscore is 6.  Patient has 5 out of 5 flexion and extension strength in her lower extremities. Normal upper extremity strength. Cognitive function normal.  Skin: Skin is warm, dry and intact. She is not diaphoretic.  Psychiatric: She has a normal mood and affect.    ED Course  Procedures (including critical care time) Labs Review Labs Reviewed  BASIC METABOLIC PANEL - Abnormal; Notable for the following:    Potassium 3.1 (*)    All other components within normal limits  CBC WITH DIFFERENTIAL/PLATELET    Imaging Review Mr Brain Wo Contrast (neuro Protocol)  10/08/2015  CLINICAL DATA:  Dizziness  and vomiting. EXAM: MRI HEAD WITHOUT CONTRAST TECHNIQUE: Multiplanar, multiecho pulse sequences of the brain and surrounding structures were obtained without intravenous contrast. COMPARISON:  CT head without contrast 10/02/2015. FINDINGS: The diffusion-weighted images demonstrate no evidence for acute or subacute infarction. No acute hemorrhage or mass lesion is present. Moderate periventricular and scattered subcortical T2 changes are evident bilaterally. The ventricles are of normal size. No significant extra-axial fluid collection is present. The internal auditory canals are within normal limits bilaterally. The brainstem is within normal limits. A remote lacunar infarct is present in the inferior right cerebellum. There is no mass effect on the fourth ventricle. No focal lesion is evident. Flow is present in the major intracranial arteries. The globes and orbits are intact. The paranasal sinuses and mastoid air cells are clear. The skullbase is within normal limits. Midline sagittal images are unremarkable. IMPRESSION: 1. No acute or focal  abnormality to explain the patient's nausea and vomiting or dizziness. 2. 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 3. No other significant posterior fossa disease. 4. Mild generalized atrophy with moderate periventricular and supratentorial subcortical white matter changes bilaterally. This likely reflects the sequela of chronic microvascular ischemia. Electronically Signed   By: San Morelle M.D.   On: 10/08/2015 19:30   I have personally reviewed and evaluated these images and lab results as part of my medical decision-making.   EKG Interpretation None      MDM   Final diagnoses:  Vertigo  Hypokalemia   Patient has symptoms of vertigo with associated tinnitus. He described gait instability. MRI does not show infarct or acute anomaly. Patient does have mild hypokalemia although I do not suspect this to be the etiology of her symptoms. Patient's heart rate and blood pressure stable. He does not have fever or signs of acute infection. At this time I do feel she is safe for discharge with home management. I do not suspect acute complications from her prior hospitalization or injury based on diagnostic results and patient's clinical condition.    Charlesetta Shanks, MD 10/08/15 2002

## 2015-10-24 DIAGNOSIS — Z87442 Personal history of urinary calculi: Secondary | ICD-10-CM | POA: Diagnosis not present

## 2015-10-24 DIAGNOSIS — H6121 Impacted cerumen, right ear: Secondary | ICD-10-CM | POA: Diagnosis not present

## 2015-10-24 DIAGNOSIS — R319 Hematuria, unspecified: Secondary | ICD-10-CM | POA: Diagnosis not present

## 2015-10-24 DIAGNOSIS — R42 Dizziness and giddiness: Secondary | ICD-10-CM | POA: Diagnosis not present

## 2015-11-11 DIAGNOSIS — H9313 Tinnitus, bilateral: Secondary | ICD-10-CM | POA: Diagnosis not present

## 2015-11-11 DIAGNOSIS — R42 Dizziness and giddiness: Secondary | ICD-10-CM | POA: Diagnosis not present

## 2015-11-11 DIAGNOSIS — H903 Sensorineural hearing loss, bilateral: Secondary | ICD-10-CM | POA: Diagnosis not present

## 2015-11-19 ENCOUNTER — Observation Stay (HOSPITAL_COMMUNITY)
Admission: EM | Admit: 2015-11-19 | Discharge: 2015-11-21 | Disposition: A | Discharge status: Home / Self Care | Payer: Medicare Other | Attending: Internal Medicine | Admitting: Internal Medicine

## 2015-11-19 ENCOUNTER — Emergency Department (HOSPITAL_COMMUNITY): Payer: Medicare Other

## 2015-11-19 ENCOUNTER — Encounter (HOSPITAL_COMMUNITY): Payer: Self-pay | Admitting: *Deleted

## 2015-11-19 DIAGNOSIS — F419 Anxiety disorder, unspecified: Secondary | ICD-10-CM | POA: Diagnosis not present

## 2015-11-19 DIAGNOSIS — H8110 Benign paroxysmal vertigo, unspecified ear: Secondary | ICD-10-CM | POA: Diagnosis present

## 2015-11-19 DIAGNOSIS — F05 Delirium due to known physiological condition: Secondary | ICD-10-CM | POA: Diagnosis not present

## 2015-11-19 DIAGNOSIS — F329 Major depressive disorder, single episode, unspecified: Secondary | ICD-10-CM | POA: Diagnosis not present

## 2015-11-19 DIAGNOSIS — R410 Disorientation, unspecified: Principal | ICD-10-CM | POA: Diagnosis present

## 2015-11-19 DIAGNOSIS — Y92009 Unspecified place in unspecified non-institutional (private) residence as the place of occurrence of the external cause: Secondary | ICD-10-CM | POA: Insufficient documentation

## 2015-11-19 DIAGNOSIS — M79602 Pain in left arm: Secondary | ICD-10-CM | POA: Diagnosis not present

## 2015-11-19 DIAGNOSIS — W19XXXA Unspecified fall, initial encounter: Secondary | ICD-10-CM | POA: Insufficient documentation

## 2015-11-19 DIAGNOSIS — I4892 Unspecified atrial flutter: Secondary | ICD-10-CM | POA: Diagnosis present

## 2015-11-19 DIAGNOSIS — M797 Fibromyalgia: Secondary | ICD-10-CM | POA: Diagnosis present

## 2015-11-19 DIAGNOSIS — I5032 Chronic diastolic (congestive) heart failure: Secondary | ICD-10-CM | POA: Insufficient documentation

## 2015-11-19 DIAGNOSIS — S8012XA Contusion of left lower leg, initial encounter: Secondary | ICD-10-CM | POA: Diagnosis not present

## 2015-11-19 DIAGNOSIS — S8011XA Contusion of right lower leg, initial encounter: Secondary | ICD-10-CM | POA: Insufficient documentation

## 2015-11-19 DIAGNOSIS — Z79899 Other long term (current) drug therapy: Secondary | ICD-10-CM | POA: Diagnosis not present

## 2015-11-19 DIAGNOSIS — M792 Neuralgia and neuritis, unspecified: Secondary | ICD-10-CM | POA: Diagnosis present

## 2015-11-19 DIAGNOSIS — S40812A Abrasion of left upper arm, initial encounter: Secondary | ICD-10-CM | POA: Insufficient documentation

## 2015-11-19 DIAGNOSIS — K219 Gastro-esophageal reflux disease without esophagitis: Secondary | ICD-10-CM | POA: Diagnosis present

## 2015-11-19 DIAGNOSIS — E785 Hyperlipidemia, unspecified: Secondary | ICD-10-CM | POA: Diagnosis present

## 2015-11-19 DIAGNOSIS — Z7901 Long term (current) use of anticoagulants: Secondary | ICD-10-CM | POA: Insufficient documentation

## 2015-11-19 DIAGNOSIS — R52 Pain, unspecified: Secondary | ICD-10-CM | POA: Diagnosis not present

## 2015-11-19 DIAGNOSIS — R42 Dizziness and giddiness: Secondary | ICD-10-CM | POA: Diagnosis not present

## 2015-11-19 DIAGNOSIS — Z7983 Long term (current) use of bisphosphonates: Secondary | ICD-10-CM | POA: Diagnosis not present

## 2015-11-19 DIAGNOSIS — I10 Essential (primary) hypertension: Secondary | ICD-10-CM | POA: Diagnosis present

## 2015-11-19 DIAGNOSIS — T426X5A Adverse effect of other antiepileptic and sedative-hypnotic drugs, initial encounter: Secondary | ICD-10-CM | POA: Insufficient documentation

## 2015-11-19 DIAGNOSIS — S199XXA Unspecified injury of neck, initial encounter: Secondary | ICD-10-CM | POA: Diagnosis not present

## 2015-11-19 DIAGNOSIS — I110 Hypertensive heart disease with heart failure: Secondary | ICD-10-CM | POA: Diagnosis not present

## 2015-11-19 DIAGNOSIS — M25551 Pain in right hip: Secondary | ICD-10-CM | POA: Insufficient documentation

## 2015-11-19 DIAGNOSIS — F411 Generalized anxiety disorder: Secondary | ICD-10-CM | POA: Diagnosis present

## 2015-11-19 DIAGNOSIS — M25552 Pain in left hip: Secondary | ICD-10-CM | POA: Diagnosis not present

## 2015-11-19 DIAGNOSIS — S0990XA Unspecified injury of head, initial encounter: Secondary | ICD-10-CM | POA: Diagnosis not present

## 2015-11-19 DIAGNOSIS — Z7951 Long term (current) use of inhaled steroids: Secondary | ICD-10-CM | POA: Diagnosis not present

## 2015-11-19 LAB — CBC WITH DIFFERENTIAL/PLATELET
BASOS ABS: 0 10*3/uL (ref 0.0–0.1)
Basophils Relative: 0 %
EOS ABS: 0.1 10*3/uL (ref 0.0–0.7)
EOS PCT: 2 %
HCT: 32.5 % — ABNORMAL LOW (ref 36.0–46.0)
Hemoglobin: 10.8 g/dL — ABNORMAL LOW (ref 12.0–15.0)
LYMPHS PCT: 30 %
Lymphs Abs: 2.4 10*3/uL (ref 0.7–4.0)
MCH: 30.9 pg (ref 26.0–34.0)
MCHC: 33.2 g/dL (ref 30.0–36.0)
MCV: 93.1 fL (ref 78.0–100.0)
MONO ABS: 1.1 10*3/uL — AB (ref 0.1–1.0)
Monocytes Relative: 13 %
Neutro Abs: 4.4 10*3/uL (ref 1.7–7.7)
Neutrophils Relative %: 55 %
PLATELETS: 234 10*3/uL (ref 150–400)
RBC: 3.49 MIL/uL — AB (ref 3.87–5.11)
RDW: 13.5 % (ref 11.5–15.5)
WBC: 8.1 10*3/uL (ref 4.0–10.5)

## 2015-11-19 LAB — URINE MICROSCOPIC-ADD ON

## 2015-11-19 LAB — COMPREHENSIVE METABOLIC PANEL
ALT: 12 U/L — AB (ref 14–54)
AST: 21 U/L (ref 15–41)
Albumin: 3.6 g/dL (ref 3.5–5.0)
Alkaline Phosphatase: 69 U/L (ref 38–126)
Anion gap: 6 (ref 5–15)
BUN: 20 mg/dL (ref 6–20)
CHLORIDE: 106 mmol/L (ref 101–111)
CO2: 27 mmol/L (ref 22–32)
Calcium: 8.7 mg/dL — ABNORMAL LOW (ref 8.9–10.3)
Creatinine, Ser: 0.88 mg/dL (ref 0.44–1.00)
Glucose, Bld: 107 mg/dL — ABNORMAL HIGH (ref 65–99)
POTASSIUM: 3.8 mmol/L (ref 3.5–5.1)
SODIUM: 139 mmol/L (ref 135–145)
Total Bilirubin: 0.6 mg/dL (ref 0.3–1.2)
Total Protein: 6.7 g/dL (ref 6.5–8.1)

## 2015-11-19 LAB — URINALYSIS, ROUTINE W REFLEX MICROSCOPIC
BILIRUBIN URINE: NEGATIVE
GLUCOSE, UA: NEGATIVE mg/dL
KETONES UR: NEGATIVE mg/dL
Leukocytes, UA: NEGATIVE
Nitrite: NEGATIVE
PROTEIN: NEGATIVE mg/dL
Specific Gravity, Urine: 1.016 (ref 1.005–1.030)
pH: 7 (ref 5.0–8.0)

## 2015-11-19 NOTE — ED Notes (Signed)
Bed: [id number] Expected date:  Expected time:  Means of arrival:  Comments: 75 fall

## 2015-11-19 NOTE — ED Notes (Signed)
Patient transported to X-ray 

## 2015-11-19 NOTE — ED Notes (Signed)
Per EMS, pt fell last night and this morning due to dizziness after taking a new medication. Pt is not sure of medication name, states medication starts with a "g". Pt states she hit her forehead on the bathroom sink when she fell today. Pt denies loss of consciousness..Brooke [name]

## 2015-11-20 ENCOUNTER — Encounter (HOSPITAL_COMMUNITY): Payer: Self-pay | Admitting: Emergency Medicine

## 2015-11-20 DIAGNOSIS — R410 Disorientation, unspecified: Secondary | ICD-10-CM | POA: Diagnosis not present

## 2015-11-20 DIAGNOSIS — F411 Generalized anxiety disorder: Secondary | ICD-10-CM | POA: Diagnosis present

## 2015-11-20 DIAGNOSIS — W19XXXA Unspecified fall, initial encounter: Secondary | ICD-10-CM | POA: Diagnosis not present

## 2015-11-20 DIAGNOSIS — H8110 Benign paroxysmal vertigo, unspecified ear: Secondary | ICD-10-CM | POA: Diagnosis not present

## 2015-11-20 DIAGNOSIS — K219 Gastro-esophageal reflux disease without esophagitis: Secondary | ICD-10-CM | POA: Diagnosis present

## 2015-11-20 DIAGNOSIS — I10 Essential (primary) hypertension: Secondary | ICD-10-CM | POA: Diagnosis present

## 2015-11-20 DIAGNOSIS — S0990XA Unspecified injury of head, initial encounter: Secondary | ICD-10-CM | POA: Diagnosis not present

## 2015-11-20 DIAGNOSIS — M797 Fibromyalgia: Secondary | ICD-10-CM | POA: Diagnosis present

## 2015-11-20 DIAGNOSIS — E785 Hyperlipidemia, unspecified: Secondary | ICD-10-CM | POA: Diagnosis present

## 2015-11-20 DIAGNOSIS — S199XXA Unspecified injury of neck, initial encounter: Secondary | ICD-10-CM | POA: Diagnosis not present

## 2015-11-20 DIAGNOSIS — I5032 Chronic diastolic (congestive) heart failure: Secondary | ICD-10-CM | POA: Insufficient documentation

## 2015-11-20 LAB — CBC
HEMATOCRIT: 33.7 % — AB (ref 36.0–46.0)
HEMOGLOBIN: 11.1 g/dL — AB (ref 12.0–15.0)
MCH: 30.6 pg (ref 26.0–34.0)
MCHC: 32.9 g/dL (ref 30.0–36.0)
MCV: 92.8 fL (ref 78.0–100.0)
Platelets: 234 10*3/uL (ref 150–400)
RBC: 3.63 MIL/uL — ABNORMAL LOW (ref 3.87–5.11)
RDW: 13.4 % (ref 11.5–15.5)
WBC: 4.8 10*3/uL (ref 4.0–10.5)

## 2015-11-20 LAB — CREATININE, SERUM: Creatinine, Ser: 0.73 mg/dL (ref 0.44–1.00)

## 2015-11-20 LAB — RAPID URINE DRUG SCREEN, HOSP PERFORMED
AMPHETAMINES: NOT DETECTED
Barbiturates: NOT DETECTED
Benzodiazepines: POSITIVE — AB
Cocaine: NOT DETECTED
OPIATES: NOT DETECTED
Tetrahydrocannabinol: NOT DETECTED

## 2015-11-20 LAB — HEPATIC FUNCTION PANEL
ALBUMIN: 3.3 g/dL — AB (ref 3.5–5.0)
ALK PHOS: 57 U/L (ref 38–126)
ALT: 13 U/L — AB (ref 14–54)
AST: 21 U/L (ref 15–41)
Bilirubin, Direct: <0.1 mg/dL — ABNORMAL LOW (ref 0.1–0.5)
TOTAL PROTEIN: 6.4 g/dL — AB (ref 6.5–8.1)
Total Bilirubin: 0.4 mg/dL (ref 0.3–1.2)

## 2015-11-20 LAB — VITAMIN B12: Vitamin B-12: 145 pg/mL — ABNORMAL LOW (ref 180–914)

## 2015-11-20 LAB — TSH: TSH: 1.143 u[IU]/mL (ref 0.350–4.500)

## 2015-11-20 LAB — PHOSPHORUS: PHOSPHORUS: 3.1 mg/dL (ref 2.5–4.6)

## 2015-11-20 LAB — MAGNESIUM: Magnesium: 2.1 mg/dL (ref 1.7–2.4)

## 2015-11-20 MED ORDER — POTASSIUM CHLORIDE CRYS ER 20 MEQ PO TBCR: 20.0000 meq | EXTENDED_RELEASE_TABLET | Freq: Every day | ORAL | Status: DC

## 2015-11-20 MED ORDER — MECLIZINE HCL 12.5 MG PO TABS
12.5000 mg | ORAL_TABLET | Freq: Three times a day (TID) | ORAL | Status: DC
  Administered 2015-11-20 – 2015-11-21 (×3): 12.5 mg via ORAL
  Filled 2015-11-20 (×5): qty 1

## 2015-11-20 MED ORDER — HEPARIN SODIUM (PORCINE) 5000 UNIT/ML IJ SOLN: 5000.0000 [IU] | Freq: Three times a day (TID) | INTRAMUSCULAR | Status: DC

## 2015-11-20 MED ORDER — CETYLPYRIDINIUM CHLORIDE 0.05 % MT LIQD
7.0000 mL | Freq: Two times a day (BID) | OROMUCOSAL | Status: DC
  Administered 2015-11-21: 7 mL via OROMUCOSAL

## 2015-11-20 MED ORDER — DIAZEPAM 5 MG PO TABS: 2.5000 mg | ORAL_TABLET | Freq: Three times a day (TID) | ORAL | Status: DC | PRN

## 2015-11-20 MED ORDER — FLUTICASONE PROPIONATE 50 MCG/ACT NA SUSP
1.0000 | Freq: Every day | NASAL | Status: DC
  Administered 2015-11-20: 1 via NASAL
  Filled 2015-11-20: qty 16

## 2015-11-20 MED ORDER — CHLORHEXIDINE GLUCONATE 0.12 % MT SOLN
15.0000 mL | Freq: Two times a day (BID) | OROMUCOSAL | Status: DC
  Administered 2015-11-20 – 2015-11-21 (×2): 15 mL via OROMUCOSAL
  Filled 2015-11-20 (×3): qty 15

## 2015-11-20 MED ORDER — MIRTAZAPINE 7.5 MG PO TABS
7.5000 mg | ORAL_TABLET | Freq: Every day | ORAL | Status: DC
  Administered 2015-11-20: 7.5 mg via ORAL
  Filled 2015-11-20 (×2): qty 1

## 2015-11-20 MED ORDER — ALPRAZOLAM 0.25 MG PO TABS: 0.2500 mg | ORAL_TABLET | Freq: Every evening | ORAL | Status: DC | PRN

## 2015-11-20 MED ORDER — TRAZODONE HCL 50 MG PO TABS
50.0000 mg | ORAL_TABLET | Freq: Every day | ORAL | Status: DC
  Administered 2015-11-20: 50 mg via ORAL
  Filled 2015-11-20 (×2): qty 1

## 2015-11-20 MED ORDER — RIVAROXABAN 20 MG PO TABS
20.0000 mg | ORAL_TABLET | Freq: Every day | ORAL | Status: DC
  Administered 2015-11-20: 20 mg via ORAL
  Filled 2015-11-20 (×2): qty 1

## 2015-11-20 MED ORDER — ALPRAZOLAM 0.25 MG PO TABS: 0.2500 mg | ORAL_TABLET | Freq: Two times a day (BID) | ORAL | Status: DC | PRN

## 2015-11-20 MED ORDER — LORAZEPAM 1 MG PO TABS
1.0000 mg | ORAL_TABLET | Freq: Once | ORAL | Status: AC
  Administered 2015-11-20: 1 mg via ORAL
  Filled 2015-11-20: qty 1

## 2015-11-20 MED ORDER — CARISOPRODOL 350 MG PO TABS
350.0000 mg | ORAL_TABLET | Freq: Every day | ORAL | Status: DC
  Administered 2015-11-20 – 2015-11-21 (×2): 350 mg via ORAL
  Filled 2015-11-20 (×2): qty 1

## 2015-11-20 MED ORDER — CITALOPRAM HYDROBROMIDE 10 MG PO TABS
10.0000 mg | ORAL_TABLET | Freq: Every day | ORAL | Status: DC
  Administered 2015-11-20 – 2015-11-21 (×2): 10 mg via ORAL
  Filled 2015-11-20 (×2): qty 1

## 2015-11-20 MED ORDER — SODIUM CHLORIDE 0.9 % IV SOLN
INTRAVENOUS | Status: AC
  Administered 2015-11-20: 14:00:00 via INTRAVENOUS

## 2015-11-20 MED ORDER — TRAMADOL HCL 50 MG PO TABS: 50.0000 mg | ORAL_TABLET | Freq: Four times a day (QID) | ORAL | Status: DC | PRN

## 2015-11-20 MED ORDER — ONDANSETRON HCL 4 MG/2ML IJ SOLN
4.0000 mg | Freq: Four times a day (QID) | INTRAMUSCULAR | Status: DC | PRN
  Administered 2015-11-20 (×3): 4 mg via INTRAVENOUS
  Filled 2015-11-20 (×4): qty 2

## 2015-11-20 MED ORDER — PANTOPRAZOLE SODIUM 40 MG PO TBEC
80.0000 mg | DELAYED_RELEASE_TABLET | Freq: Every day | ORAL | Status: DC
Start: 2015-11-20 — End: 2015-11-21
  Administered 2015-11-20 – 2015-11-21 (×2): 80 mg via ORAL
  Filled 2015-11-20 (×2): qty 2

## 2015-11-20 MED ORDER — METOPROLOL SUCCINATE ER 25 MG PO TB24
25.0000 mg | ORAL_TABLET | Freq: Every day | ORAL | Status: DC
  Administered 2015-11-20 – 2015-11-21 (×2): 25 mg via ORAL
  Filled 2015-11-20 (×2): qty 1

## 2015-11-20 MED ORDER — ACETAMINOPHEN 325 MG PO TABS
650.0000 mg | ORAL_TABLET | Freq: Four times a day (QID) | ORAL | Status: DC | PRN
  Administered 2015-11-20: 650 mg via ORAL
  Filled 2015-11-20: qty 2

## 2015-11-20 MED ORDER — ROSUVASTATIN CALCIUM 10 MG PO TABS
10.0000 mg | ORAL_TABLET | Freq: Every day | ORAL | Status: DC
  Administered 2015-11-20: 10 mg via ORAL
  Filled 2015-11-20 (×2): qty 1

## 2015-11-20 MED ORDER — CARISOPRODOL 350 MG PO TABS: 350.0000 mg | ORAL_TABLET | Freq: Two times a day (BID) | ORAL | Status: DC

## 2015-11-20 NOTE — ED Provider Notes (Signed)
After monitoring in the ED, pt arousable but still confused, she is unable to tell me her birthdate or where she lives She still perseverates about someone named "Peyton NajjarLarry" Pt with delirium of unclear etiology Will admit D/w dr Gwenlyn Perkingmadera for admission   Zadie Rhineonald Courteny Egler, MD 11/20/15 [phone]

## 2015-11-20 NOTE — ED Provider Notes (Signed)
CSN: [id number]     Arrival date & time 11/19/15  2045 History   First MD Initiated Contact with Patient 11/19/15 2155     Chief Complaint  Patient presents with  . Fall  . Hip Pain  . Dizziness     (Consider location/radiation/quality/duration/timing/severity/associated sxs/prior Treatment) Patient is a 75 y.o. female presenting with fall, hip pain, and dizziness. The history is provided by the patient and the EMS personnel.  Fall Pertinent negatives include no chest pain, no abdominal pain, no headaches and no shortness of breath.  Hip Pain Pertinent negatives include no chest pain, no abdominal pain, no headaches and no shortness of breath.  Dizziness Associated symptoms: no chest pain, no headaches and no shortness of breath   Patient brought in by EMS for 2 falls. One occurred today the other was last night. Patient with a complaint of some dizziness she is on blood thinners. Patient also with concern about possible hip injury. Patient may have been taken off of the blood thinner due to a recent pelvic hematoma.  Patient with some abrasions to her left arm and to some bruising to both legs. Patient states maybe a little bit of  soreness to the left side of the neck. Patient states she hit her head when she fell off the sink with the second fall today.  Past Medical History  Diagnosis Date  . Hypertension   . Anxiety    Past Surgical History  Procedure Laterality Date  . Bunionectomy Left 07/21/2009    Left foot  . Capsulotomy Right 07/21/2009    Rt #2 MPJ  . Tenotomy / flexor tendon transfer Right 07/21/2009    Right foot  . Hammer toe repair Right 07/21/2009    Rt #2  . Osteotomy Right 02/03/2015    Rt 5th met  . Tarsal exostectomy Right 02/03/2015  . Joint replacement    . Back surgery     No family history on file. Social History  Substance Use Topics  . Smoking status: Never Smoker   . Smokeless tobacco: Never Used  . Alcohol Use: No   OB History    No data  available     Review of Systems  Constitutional: Negative for fever.  HENT: Negative for congestion.   Eyes: Negative for redness.  Respiratory: Negative for shortness of breath.   Cardiovascular: Negative for chest pain.  Gastrointestinal: Negative for abdominal pain.  Genitourinary: Negative for dysuria.  Musculoskeletal: Positive for neck pain. Negative for back pain.  Skin: Negative for rash and wound.  Neurological: Positive for dizziness. Negative for headaches.  Hematological: Bruises/bleeds easily.  Psychiatric/Behavioral: Negative for confusion.      Allergies  Codeine; Penicillins; and Tramadol  Home Medications   Prior to Admission medications   Medication Sig Start Date End Date Taking? Authorizing Provider  acetaminophen (TYLENOL) 650 MG CR tablet Take 1 tablet (650 mg total) by mouth every 8 (eight) hours as needed for pain. 10/03/15   Silver Huguenin Elgergawy, MD  alendronate (FOSAMAX) 70 MG tablet TAKE 70 MG EVERY WEEK IN THE MORNING AT LEAST 30 MINUTES BEFORE 1ST FOOD/BEVERAGE OF THE DAY 11/30/14   Historical Provider, MD  ALPRAZolam Duanne Moron) 0.25 MG tablet take 0.25 MG by oral route 2 times every day as needed anxiety 12/29/14   Historical Provider, MD  aspirin 81 MG chewable tablet Chew 1 tablet (81 mg total) by mouth daily. Patient resume on 10/06/2015 Patient not taking: Reported on 10/08/2015 10/06/15   Silver Huguenin  Elgergawy, MD  baclofen (LIORESAL) 20 MG tablet Take 20 mg by mouth 3 (three) times daily.    Historical Provider, MD  carisoprodol (SOMA) 350 MG tablet take 344m twice daily 12/29/14 12/30/15  Historical Provider, MD  citalopram (CELEXA) 10 MG tablet Take 10 mg by mouth daily.    Historical Provider, MD  diazepam (VALIUM) 5 MG tablet Take 1 tablet (5 mg total) by mouth every 8 (eight) hours as needed for anxiety (Dizziness.). 10/08/15   MCharlesetta Shanks MD  esomeprazole (NEXIUM) 40 MG capsule Take 40 mg by mouth daily at 12 noon.    Historical Provider, MD  fluticasone  (FLONASE) 50 MCG/ACT nasal spray INHALE 1 [facility]AY [facility] 11/22/14   Historical Provider, MD  gabapentin (NEURONTIN) 300 MG capsule Take 300 mg by mouth every evening.     Historical Provider, MD  meclizine (ANTIVERT) 25 MG tablet Take 1 tablet (25 mg total) by mouth 3 (three) times daily as needed for dizziness. 10/08/15   MCharlesetta Shanks MD  metoprolol succinate (TOPROL-XL) 25 MG 24 hr tablet Take 25 mg by mouth daily.     Historical Provider, MD  mirtazapine (REMERON) 7.5 MG tablet take 7.536mat bedtime may take a second dose in 2 hours if not effective 12/29/14   Historical Provider, MD  potassium chloride SA (K-DUR,KLOR-CON) 20 MEQ tablet Take 1 tablet (20 mEq total) by mouth daily. 10/08/15   MaCharlesetta ShanksMD  rivaroxaban (XARELTO) 20 MG TABS tablet Take 1 tablet (20 mg total) by mouth daily with supper. Please resume on 10/06/2015 Patient not taking: Reported on 10/08/2015 10/06/15   DaSilver Hugueninlgergawy, MD  rosuvastatin (CRESTOR) 10 MG tablet Take 10 mg by mouth.    Historical Provider, MD  traMADol (ULTRAM) 50 MG tablet Take 50 mg by mouth every 6 (six) hours as needed for moderate pain.     Historical Provider, MD  traZODone (DESYREL) 50 MG tablet Take 50 mg by mouth at bedtime.    Historical Provider, MD  Vitamin D, Ergocalciferol, (DRISDOL) 50000 UNITS CAPS capsule TAKE 1 CAPSULE 2 TIMES EVERY WEEK 11/30/14   Historical Provider, MD  zolpidem (AMBIEN CR) 12.5 MG CR tablet Take 12.5 mg by mouth.    Historical Provider, MD   BP 138/75 mmHg  Pulse 75  Temp(Src) 98.7 F (37.1 C) (Oral)  Resp 16  SpO2 100% Physical Exam  Constitutional: She is oriented to person, place, and time. She appears well-developed and well-nourished. No distress.  HENT:  Head: Normocephalic and atraumatic.  Mouth/Throat: Oropharynx is clear and moist.  Eyes: Conjunctivae and EOM are normal. Pupils are equal, round, and reactive to light.  Neck: Normal range of motion.  Cardiovascular: Normal rate, regular  rhythm and normal heart sounds.   No murmur heard. Pulmonary/Chest: Effort normal and breath sounds normal. No respiratory distress.  Abdominal: Soft. Bowel sounds are normal. There is no tenderness.  Musculoskeletal: Normal range of motion.  Mild discomfort with movement of hips. Patient was some bruising on both legs some are old. Some streaky abrasions no open wounds to left arm.  Neurological: She is alert and oriented to person, place, and time. No cranial nerve deficit. She exhibits normal muscle tone. Coordination normal.  Skin: Skin is warm. No rash noted.  Nursing note and vitals reviewed.   ED Course  Procedures (including critical care time) Labs Review Labs Reviewed  URINALYSIS, ROUTINE W REFLEX MICROSCOPIC [facility]- Abnormal; Notable for the following:  APPearance CLOUDY (*)    Hgb urine dipstick TRACE (*)    All other components within normal limits  CBC WITH DIFFERENTIAL/PLATELET - Abnormal; Notable for the following:    RBC 3.49 (*)    Hemoglobin 10.8 (*)    HCT 32.5 (*)    Monocytes Absolute 1.1 (*)    All other components within normal limits  COMPREHENSIVE METABOLIC PANEL - Abnormal; Notable for the following:    Glucose, Bld 107 (*)    Calcium 8.7 (*)    ALT 12 (*)    All other components within normal limits  URINE MICROSCOPIC-ADD ON - Abnormal; Notable for the following:    Squamous Epithelial / LPF 0-5 (*)    Bacteria, UA RARE (*)    All other components within normal limits   Results for orders placed or performed during the hospital encounter of 11/19/15  Urinalysis, Routine w reflex microscopic (not at [facility])  Result Value Ref Range   Color, Urine YELLOW YELLOW   APPearance CLOUDY (A) CLEAR   Specific Gravity, Urine 1.016 1.005 - 1.030   pH 7.0 5.0 - 8.0   Glucose, UA NEGATIVE NEGATIVE mg/dL   Hgb urine dipstick TRACE (A) NEGATIVE   Bilirubin Urine NEGATIVE NEGATIVE   Ketones, ur NEGATIVE NEGATIVE mg/dL   Protein, ur NEGATIVE NEGATIVE  mg/dL   Nitrite NEGATIVE NEGATIVE   Leukocytes, UA NEGATIVE NEGATIVE  CBC with Differential/Platelet  Result Value Ref Range   WBC 8.1 4.0 - 10.5 K/uL   RBC 3.49 (L) 3.87 - 5.11 MIL/uL   Hemoglobin 10.8 (L) 12.0 - 15.0 g/dL   HCT 32.5 (L) 36.0 - 46.0 %   MCV 93.1 78.0 - 100.0 fL   MCH 30.9 26.0 - 34.0 pg   MCHC 33.2 30.0 - 36.0 g/dL   RDW 13.5 11.5 - 15.5 %   Platelets 234 150 - 400 K/uL   Neutrophils Relative % 55 %   Neutro Abs 4.4 1.7 - 7.7 K/uL   Lymphocytes Relative 30 %   Lymphs Abs 2.4 0.7 - 4.0 K/uL   Monocytes Relative 13 %   Monocytes Absolute 1.1 (H) 0.1 - 1.0 K/uL   Eosinophils Relative 2 %   Eosinophils Absolute 0.1 0.0 - 0.7 K/uL   Basophils Relative 0 %   Basophils Absolute 0.0 0.0 - 0.1 K/uL  Comprehensive metabolic panel  Result Value Ref Range   Sodium 139 135 - 145 mmol/L   Potassium 3.8 3.5 - 5.1 mmol/L   Chloride 106 101 - 111 mmol/L   CO2 27 22 - 32 mmol/L   Glucose, Bld 107 (H) 65 - 99 mg/dL   BUN 20 6 - 20 mg/dL   Creatinine, Ser 0.88 0.44 - 1.00 mg/dL   Calcium 8.7 (L) 8.9 - 10.3 mg/dL   Total Protein 6.7 6.5 - 8.1 g/dL   Albumin 3.6 3.5 - 5.0 g/dL   AST 21 15 - 41 U/L   ALT 12 (L) 14 - 54 U/L   Alkaline Phosphatase 69 38 - 126 U/L   Total Bilirubin 0.6 0.3 - 1.2 mg/dL   GFR calc non Af Amer >60 >60 mL/min   GFR calc Af Amer >60 >60 mL/min   Anion gap 6 5 - 15  Urine microscopic-add on  Result Value Ref Range   Squamous Epithelial / LPF 0-5 (A) NONE SEEN   WBC, UA 0-5 0 - 5 WBC/hpf   RBC / HPF 0-5 0 - 5 RBC/hpf   Bacteria, UA RARE (  A) NONE SEEN     Imaging Review Dg Chest 2 View  11/20/2015  CLINICAL DATA:  75 year old who fell last night and again this morning due to dizziness which she relates to a new medication. Bilateral hip pain. Current history of hypertension. Patient struck her forehead on the bathroom sink when she fell today. EXAM: CHEST  2 VIEW COMPARISON:  01/09/2011 and earlier, including CTA chest 119 1,012. FINDINGS: AP  semi-erect and lateral images were obtained. Cardiac silhouette normal in size, unchanged. Thoracic aorta tortuous and mildly atherosclerotic, unchanged. Hilar and mediastinal contours otherwise unremarkable. Lungs clear. Bronchovascular markings normal. Pulmonary vascularity normal. No visible pleural effusions. No pneumothorax. 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 IMPRESSION: No acute cardiopulmonary disease. Electronically Signed   By: Evangeline Dakin M.D.   On: 11/20/2015 00:06   Ct Head Wo Contrast  11/20/2015  CLINICAL DATA:  Fall. EXAM: CT HEAD WITHOUT CONTRAST CT CERVICAL SPINE WITHOUT CONTRAST TECHNIQUE: Multidetector CT imaging of the head and cervical spine was performed following the standard protocol without intravenous contrast. Multiplanar CT image reconstructions of the cervical spine were also generated. COMPARISON:  CT head and cervical spine 10/02/2015. MRI brain 10/18/2015. FINDINGS: CT HEAD FINDINGS Ventricles and sulci appear symmetrical. No ventricular dilatation. No mass effect or midline shift. No abnormal extra-axial fluid collections. Gray-white matter junctions are distinct. Basal cisterns are not effaced. No evidence of acute intracranial hemorrhage. No depressed skull fractures. Visualized paranasal sinuses and mastoid air cells are not opacified. CT CERVICAL SPINE FINDINGS Diffuse degenerative changes in the cervical spine with narrowed interspaces and endplate hypertrophic changes throughout. Degenerative changes are most prominent at C4-5 and C6-7 levels. Prominent disc osteophyte complexes demonstrated at C4-5 and C6-7 encroach upon the anterior central canal. There is slight anterior subluxation of C2 on C3 and of C3 on C4. This is more prominent than was present on the previous study. This may be degenerative but ligamentous injury is not excluded. Correlation with physical examination recommended. MRI would be more sensitive for evaluation  of ligamentous structures if ligamentous injury is suspected clinically. 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 Degenerative changes throughout the facet joints. Normal alignment of the facet joints. No prevertebral soft tissue swelling. No vertebral compression deformities. C1-2 articulation appears intact. Vascular calcifications. Soft tissues are unremarkable. IMPRESSION: No acute intracranial abnormalities. Degenerative changes throughout the cervical spine. Slight anterior subluxations at C2-3 and C3-4 are not seen on previous study. This could be degenerative but ligamentous injury is not excluded. No acute displaced fractures identified. Electronically Signed   By: Lucienne Capers M.D.   On: 11/20/2015 00:33   Ct Cervical Spine Wo Contrast  11/20/2015  CLINICAL DATA:  Fall. EXAM: CT HEAD WITHOUT CONTRAST CT CERVICAL SPINE WITHOUT CONTRAST TECHNIQUE: Multidetector CT imaging of the head and cervical spine was performed following the standard protocol without intravenous contrast. Multiplanar CT image reconstructions of the cervical spine were also generated. COMPARISON:  CT head and cervical spine 10/02/2015. MRI brain 10/18/2015. FINDINGS: CT HEAD FINDINGS Ventricles and sulci appear symmetrical. No ventricular dilatation. No mass effect or midline shift. No abnormal extra-axial fluid collections. Gray-white matter junctions are distinct. Basal cisterns are not effaced. No evidence of acute intracranial hemorrhage. No depressed skull fractures. Visualized paranasal sinuses and mastoid air cells are not opacified. CT CERVICAL SPINE FINDINGS Diffuse degenerative changes in the cervical spine with narrowed interspaces and endplate hypertrophic changes throughout. Degenerative changes are most prominent at C4-5 and C6-7 levels. Prominent  disc osteophyte complexes demonstrated at C4-5 and C6-7 encroach upon the anterior central canal. There is slight anterior subluxation of C2 on C3 and of C3 on  C4. This is more prominent than was present on the previous study. This may be degenerative but ligamentous injury is not excluded. Correlation with physical examination recommended. MRI would be more sensitive for evaluation of ligamentous structures if ligamentous injury is suspected clinically. 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 Degenerative changes throughout the facet joints. Normal alignment of the facet joints. No prevertebral soft tissue swelling. No vertebral compression deformities. C1-2 articulation appears intact. Vascular calcifications. Soft tissues are unremarkable. IMPRESSION: No acute intracranial abnormalities. Degenerative changes throughout the cervical spine. Slight anterior subluxations at C2-3 and C3-4 are not seen on previous study. This could be degenerative but ligamentous injury is not excluded. No acute displaced fractures identified. Electronically Signed   By: Lucienne Capers M.D.   On: 11/20/2015 00:33   Dg Hips Bilat With Pelvis 3-4 Views  11/20/2015  CLINICAL DATA:  75 year old who fell last night and again this morning due to dizziness which she relates to a new medication. Bilateral hip pain. Current history of hypertension. Patient struck her forehead on the bathroom sink when she fell today. EXAM: DG HIP (WITH OR WITHOUT PELVIS) 3-4V BILAT COMPARISON:  None. FINDINGS: No acute fractures involving the bony pelvis or either proximal femur. Both hip joints anatomically aligned with well-preserved joint spaces. Sacroiliac joints and symphysis pubis intact. Bone mineral density well-preserved for age. Prior L3 through L5 fusion and posterior decompression. IMPRESSION: No acute osseous abnormalities. Electronically Signed   By: Evangeline Dakin M.D.   On: 11/20/2015 00:08   I have personally reviewed and evaluated these images and lab results as part of my medical decision-making.   EKG Interpretation   Date/Time:  Sunday Nov 20 2015 00:48:01  EDT Ventricular Rate:  66 PR Interval:  180 QRS Duration: 97 QT Interval:  454 QTC Calculation: 476 R Axis:   -2 Text Interpretation:  Sinus rhythm Borderline T abnormalities, anterior  leads Confirmed by Jamauri Kruzel  MD, Carah Barrientes ([id number]) on 11/20/2015 1:09:28 AM      MDM   Final diagnoses:  Fall, initial encounter  Hip pain, bilateral    Patient with 2 falls at home. One of them she slipped off the sink therefore head. Patient is on blood thinners. Patient complaint were minimal. Does have the some abrasions and bruising on her legs and left arm. No obvious deformities. Was concerned also about hip pain. X-rays of bilateral hips are negative CT of head and neck without any acute findings. Patient brought in by EMS. Patient stable for discharge home and follow-up with her doctor. Labs without any significant findings.    Fredia Sorrow, MD 11/20/15 [phone]

## 2015-11-20 NOTE — ED Notes (Signed)
Need to call family to pick up patient ( nursing communication)

## 2015-11-20 NOTE — ED Notes (Signed)
Provider verbalized pt to be admitted with altered mental status

## 2015-11-20 NOTE — ED Notes (Signed)
Patient is resting comfortably. 

## 2015-11-20 NOTE — H&P (Addendum)
History and Physical    Brooke [name] ZJQ:[id number] DOB: 06-12-1941 DOA: 11/19/2015  Referring Provider: Dr. Alvino Chapel  PCP: Maximino Greenland, MD  Patient coming from: Home  Chief Complaint: AMS, mechanical fall, dizziness   HPI: Brooke [name] is a 75 y.o. female with PMH significant for paroxysmal A. Fib (on xarelto), HTN, HLD, GERD, Anxiety/depression, right foot neuropathy and chronic diastolic heart failure; presented to ED secondary to mechanical fall and complaint of hip pain. Patient reprots some dizziness prior to the fall. Has been diagnosed with vertigo and was supposed to be on meclizine. Patient also reported newly starting neurotnin 300 mg TID; even she had changed to twice a day due to feeling worn out and very sleepy (she took 350m BID prior to admission).Patient reported some Headache and nausea. Patient after complete evaluation in ED, was initially to be discharged, but was found with acute delirium and poor insight, reason why [facility] was called to admit patient for observation. Of note; she denies CP, SOB, vomiting, dysuria, hematuria, abd pain, Hematemesis, hematochezia and melena.   ED Course: patient with neg CT head; essentially normal blood work and mainly ask to admit for observation due to delirium. She received gentle IVF's and some benzodiazepine   Review of Systems:  All other systems reviewed and apart from HPI, are negative.  Past Medical History  Diagnosis Date  . Hypertension   . Anxiety     Past Surgical History  Procedure Laterality Date  . Bunionectomy Left 07/21/2009    Left foot  . Capsulotomy Right 07/21/2009    Rt #2 MPJ  . Tenotomy / flexor tendon transfer Right 07/21/2009    Right foot  . Hammer toe repair Right 07/21/2009    Rt #2  . Osteotomy Right 02/03/2015    Rt 5th met  . Tarsal exostectomy Right 02/03/2015  . Joint replacement    . Back surgery       reports that she has never smoked. She has never used smokeless tobacco. She  reports that she does not drink alcohol or use illicit drugs.  Allergies  Allergen Reactions  . Codeine Nausea And Vomiting  . Penicillins Nausea And Vomiting    Has patient had a PCN reaction causing immediate rash, facial/tongue/throat swelling, SOB or lightheadedness with hypotension: No Has patient had a PCN reaction causing severe rash involving mucus membranes or skin necrosis: No Has patient had a PCN reaction that required hospitalization No Has patient had a PCN reaction occurring within the last 10 years: No If all of the above answers are "NO", then may proceed with Cephalosporin use.   . Tramadol Nausea And Vomiting    Family History: significant for HTN and heart problems on his father.  Prior to Admission medications   Medication Sig Start Date End Date Taking? Authorizing Provider  alendronate (FOSAMAX) 70 MG tablet TAKE 70 MG EVERY WEEK IN THE MORNING AT LEAST 30 MINUTES BEFORE 1ST FOOD/BEVERAGE OF THE DAY 11/30/14  Yes Historical Provider, MD  ALPRAZolam (Duanne Moron 0.25 MG tablet take 0.25 MG by oral route 2 times every day as needed anxiety 12/29/14  Yes Historical Provider, MD  baclofen (LIORESAL) 20 MG tablet Take 20 mg by mouth 3 (three) times daily.   Yes Historical Provider, MD  citalopram (CELEXA) 10 MG tablet Take 10 mg by mouth daily.   Yes Historical Provider, MD  esomeprazole (NEXIUM) 40 MG capsule Take 40 mg by mouth daily at 12 noon.   Yes Historical Provider, MD  fluticasone (FLONASE) 50 MCG/ACT nasal spray INHALE 1 SPRAY DAILY IN EACH NOSTRIL 11/22/14  Yes Historical Provider, MD  gabapentin (NEURONTIN) 300 MG capsule Take 300 mg by mouth every evening.    Yes Historical Provider, MD  metoprolol succinate (TOPROL-XL) 25 MG 24 hr tablet Take 25 mg by mouth daily.    Yes Historical Provider, MD  mirtazapine (REMERON) 7.5 MG tablet take 7.16m at bedtime may take a second dose in 2 hours if not effective 12/29/14  Yes Historical Provider, MD  rosuvastatin (CRESTOR) 10  MG tablet Take 10 mg by mouth daily. Reported on 11/20/2015   Yes Historical Provider, MD  traMADol (ULTRAM) 50 MG tablet Take 50 mg by mouth every 6 (six) hours as needed for moderate pain.    Yes Historical Provider, MD  traZODone (DESYREL) 50 MG tablet Take 50 mg by mouth at bedtime.   Yes Historical Provider, MD  acetaminophen (TYLENOL) 650 MG CR tablet Take 1 tablet (650 mg total) by mouth every 8 (eight) hours as needed for pain. 10/03/15   DAlbertine Patricia MD  carisoprodol (SOMA) 350 MG tablet take 3581mtwice daily 12/29/14 12/30/15  Historical Provider, MD  diazepam (VALIUM) 5 MG tablet Take 1 tablet (5 mg total) by mouth every 8 (eight) hours as needed for anxiety (Dizziness.). 10/08/15   MaCharlesetta ShanksMD  meclizine (ANTIVERT) 25 MG tablet Take 1 tablet (25 mg total) by mouth 3 (three) times daily as needed for dizziness. 10/08/15   MaCharlesetta ShanksMD  potassium chloride SA (K-DUR,KLOR-CON) 20 MEQ tablet Take 1 tablet (20 mEq total) by mouth daily. 10/08/15   MaCharlesetta ShanksMD  rivaroxaban (XARELTO) 20 MG TABS tablet Take 1 tablet (20 mg total) by mouth daily with supper. Please resume on 10/06/2015 Patient not taking: Reported on 10/08/2015 10/06/15   DaSilver Hugueninlgergawy, MD  Vitamin D, Ergocalciferol, (DRISDOL) 50000 UNITS CAPS capsule TAKE 1 CAPSULE 2 TIMES EVERY WEEK 11/30/14   Historical Provider, MD  zolpidem (AMBIEN CR) 12.5 MG CR tablet Take 12.5 mg by mouth.    Historical Provider, MD    Physical Exam: Filed Vitals:   11/20/15 0423 11/20/15 0630 11/20/15 0645 11/20/15 0827  BP: 118/63 102/60  146/78  Pulse: 67 59 62 72  Temp:    97.9 F (36.6 C)  TempSrc:    Oral  Resp: _0 Height:    5' (1.524 m)  Weight:    58.1 kg (128 lb 1.4 oz)  SpO2: 99% 92% 92% 100%    Constitutional: NAD, calm, comfortable; on my evaluation she was oriented X 3; but insight was still  Eyes: PERRLA, lids and conjunctivae normal ENMT: Mucous membranes are moist. Posterior pharynx clear of any  exudate or lesions. Normal dentition.  Neck: normal, supple, no masses, no thyromegaly, no JVD Respiratory: clear to auscultation bilaterally, no wheezing, no crackles. Normal respiratory effort. No accessory muscle use.  Cardiovascular: S1 & S2 heard, regular rate; no murmurs / rubs / gallops. No extremity edema. 2+ pedal pulses. No carotid bruits.  Abdomen: No distension, no tenderness, no masses palpated. No hepatosplenomegaly. Bowel sounds normal.  Musculoskeletal: no clubbing / cyanosis. No joint deformity upper and lower extremities. Good ROM, no contractures. Normal muscle tone.  Skin: no rashes, petechiae or open wounds. Neurologic: CN 2-12 grossly intact. Sensation intact, DTR normal. Strength 5/5 in all 4 limbs.  Psychiatric: Alert and oriented x 3. Normal mood. No SI or hallucinations    Labs on Admission:  I have personally reviewed following labs and imaging studies  CBC:  Recent Labs Lab 11/19/15 2316  WBC 8.1  NEUTROABS 4.4  HGB 10.8*  HCT 32.5*  MCV 93.1  PLT 673   Basic Metabolic Panel:  Recent Labs Lab 11/19/15 2316  NA 139  K 3.8  CL 106  CO2 27  GLUCOSE 107*  BUN 20  CREATININE 0.88  CALCIUM 8.7*   GFR: Estimated Creatinine Clearance: 44.7 mL/min (by C-G formula based on Cr of 0.88).   Liver Function Tests:  Recent Labs Lab 11/19/15 2316  AST 21  ALT 12*  ALKPHOS 69  BILITOT 0.6  PROT 6.7  ALBUMIN 3.6   Thyroid Function Tests:  Recent Labs  11/20/15 0745  TSH 1.143   Anemia Panel:  Recent Labs  11/20/15 0745  VITAMINB12 145*   Urine analysis:    Component Value Date/Time   COLORURINE YELLOW 11/19/2015 2336   APPEARANCEUR CLOUDY* 11/19/2015 2336   LABSPEC 1.016 11/19/2015 2336   PHURINE 7.0 11/19/2015 2336   GLUCOSEU NEGATIVE 11/19/2015 2336   HGBUR TRACE* 11/19/2015 2336   BILIRUBINUR NEGATIVE 11/19/2015 2336   KETONESUR NEGATIVE 11/19/2015 2336   PROTEINUR NEGATIVE 11/19/2015 2336   UROBILINOGEN 0.2 07/20/2010 1230    NITRITE NEGATIVE 11/19/2015 2336   LEUKOCYTESUR NEGATIVE 11/19/2015 2336   Radiological Exams on Admission: Dg Chest 2 View  11/20/2015  CLINICAL DATA:  75 year old who fell last night and again this morning due to dizziness which she relates to a new medication. Bilateral hip pain. Current history of hypertension. Patient struck her forehead on the bathroom sink when she fell today. EXAM: CHEST  2 VIEW COMPARISON:  01/09/2011 and earlier, including CTA chest 119 1,012. FINDINGS: AP semi-erect and lateral images were obtained. Cardiac silhouette normal in size, unchanged. Thoracic aorta tortuous and mildly atherosclerotic, unchanged. Hilar and mediastinal contours otherwise unremarkable. Lungs clear. Bronchovascular markings normal. Pulmonary vascularity normal. No visible pleural effusions. No pneumothorax. 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 IMPRESSION: No acute cardiopulmonary disease. Electronically Signed   By: Evangeline Dakin M.D.   On: 11/20/2015 00:06   Ct Head Wo Contrast  11/20/2015  CLINICAL DATA:  Fall. EXAM: CT HEAD WITHOUT CONTRAST CT CERVICAL SPINE WITHOUT CONTRAST TECHNIQUE: Multidetector CT imaging of the head and cervical spine was performed following the standard protocol without intravenous contrast. Multiplanar CT image reconstructions of the cervical spine were also generated. COMPARISON:  CT head and cervical spine 10/02/2015. MRI brain 10/18/2015. FINDINGS: CT HEAD FINDINGS Ventricles and sulci appear symmetrical. No ventricular dilatation. No mass effect or midline shift. No abnormal extra-axial fluid collections. Gray-white matter junctions are distinct. Basal cisterns are not effaced. No evidence of acute intracranial hemorrhage. No depressed skull fractures. Visualized paranasal sinuses and mastoid air cells are not opacified. CT CERVICAL SPINE FINDINGS Diffuse degenerative changes in the cervical spine with narrowed interspaces and endplate  hypertrophic changes throughout. Degenerative changes are most prominent at C4-5 and C6-7 levels. Prominent disc osteophyte complexes demonstrated at C4-5 and C6-7 encroach upon the anterior central canal. There is slight anterior subluxation of C2 on C3 and of C3 on C4. This is more prominent than was present on the previous study. This may be degenerative but ligamentous injury is not excluded. Correlation with physical examination recommended. MRI would be more sensitive for evaluation of ligamentous structures if ligamentous injury is suspected clinically. 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 Degenerative changes throughout the facet joints. Normal alignment of the facet  joints. No prevertebral soft tissue swelling. No vertebral compression deformities. C1-2 articulation appears intact. Vascular calcifications. Soft tissues are unremarkable. IMPRESSION: No acute intracranial abnormalities. Degenerative changes throughout the cervical spine. Slight anterior subluxations at C2-3 and C3-4 are not seen on previous study. This could be degenerative but ligamentous injury is not excluded. No acute displaced fractures identified. Electronically Signed   By: Lucienne Capers M.D.   On: 11/20/2015 00:33   Ct Cervical Spine Wo Contrast  11/20/2015  CLINICAL DATA:  Fall. EXAM: CT HEAD WITHOUT CONTRAST CT CERVICAL SPINE WITHOUT CONTRAST TECHNIQUE: Multidetector CT imaging of the head and cervical spine was performed following the standard protocol without intravenous contrast. Multiplanar CT image reconstructions of the cervical spine were also generated. COMPARISON:  CT head and cervical spine 10/02/2015. MRI brain 10/18/2015. FINDINGS: CT HEAD FINDINGS Ventricles and sulci appear symmetrical. No ventricular dilatation. No mass effect or midline shift. No abnormal extra-axial fluid collections. Gray-white matter junctions are distinct. Basal cisterns are not effaced. No evidence of acute intracranial  hemorrhage. No depressed skull fractures. Visualized paranasal sinuses and mastoid air cells are not opacified. CT CERVICAL SPINE FINDINGS Diffuse degenerative changes in the cervical spine with narrowed interspaces and endplate hypertrophic changes throughout. Degenerative changes are most prominent at C4-5 and C6-7 levels. Prominent disc osteophyte complexes demonstrated at C4-5 and C6-7 encroach upon the anterior central canal. There is slight anterior subluxation of C2 on C3 and of C3 on C4. This is more prominent than was present on the previous study. This may be degenerative but ligamentous injury is not excluded. Correlation with physical examination recommended. MRI would be more sensitive for evaluation of ligamentous structures if ligamentous injury is suspected clinically. 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 Degenerative changes throughout the facet joints. Normal alignment of the facet joints. No prevertebral soft tissue swelling. No vertebral compression deformities. C1-2 articulation appears intact. Vascular calcifications. Soft tissues are unremarkable. IMPRESSION: No acute intracranial abnormalities. Degenerative changes throughout the cervical spine. Slight anterior subluxations at C2-3 and C3-4 are not seen on previous study. This could be degenerative but ligamentous injury is not excluded. No acute displaced fractures identified. Electronically Signed   By: Lucienne Capers M.D.   On: 11/20/2015 00:33   Dg Hips Bilat With Pelvis 3-4 Views  11/20/2015  CLINICAL DATA:  75 year old who fell last night and again this morning due to dizziness which she relates to a new medication. Bilateral hip pain. Current history of hypertension. Patient struck her forehead on the bathroom sink when she fell today. EXAM: DG HIP (WITH OR WITHOUT PELVIS) 3-4V BILAT COMPARISON:  None. FINDINGS: No acute fractures involving the bony pelvis or either proximal femur. Both hip joints anatomically  aligned with well-preserved joint spaces. Sacroiliac joints and symphysis pubis intact. Bone mineral density well-preserved for age. Prior L3 through L5 fusion and posterior decompression. IMPRESSION: No acute osseous abnormalities. Electronically Signed   By: Evangeline Dakin M.D.   On: 11/20/2015 00:08    EKG:  Sinus rhythm, normal rate and Axis; no acute ischemic changes   Assessment/Plan 1-delirium: associated to medication (polypharmacy); but per patient especially the use of neurontin  -neurontin discontinued -rest of medications adjusted (dose decreased/extent frequency between dosages) -will provide very gentle IVF's -follow TSH, B12 and RPR to complete work up -PT to evaluate patient for vestibular assessment  2-Neuralgia of right foot: -stable overall -will discontinue neurontin -will check B12 -continue supportive care and adjusted analgesic dose  3-Paroxysmal atrial flutter ([place]): -sinus  rhythm while in ED -continue toprol and xarelto -CHADsVASC score 3  4-HTN (hypertension), benign: -stable -will continue current medication regimen -advise to follow heart healthy diet   5-Anxiety state/depression: -will continue home medication regimen  -dose adjusted given acute delirium  -no SI or hallucinations   6-Benign paroxysmal positional vertigo: -will continue meclizine -PT for vestibular assessment requested  7-Fibromyalgia: -will continue chronic tramadol  8-GERD (gastroesophageal reflux disease): -will continue PPI  9-HLD (hyperlipidemia): -continue statins      Time: 55 minutes   DVT prophylaxis: on xarelto Code Status: Full Family Communication: no family at bedside  Disposition Plan: home when medically stable; most likely on 5/22 Consults called: none  Admission status: observation, med-surg, LOS < 2 midnights     Barton Dubois MD [facility] Pager [phone]  If 7PM-7AM, please contact night-coverage [email or web address] Password  [facility]  11/20/2015, 12:34 PM

## 2015-11-20 NOTE — ED Notes (Addendum)
Prior to discharging patient, she seem to be confused and more fidgety. Her words were random and inappropriate to the questions that were being asked at the time. She acted as if she did not understand simple commands. Ex:I asked patient stand up and she would lie down, "put your arm inside your gown". "Who can we call to get you home?" and she would respond, "Fire, Sorry". She repetive asked about VS, even after letting her know the value and that everything looked normal. MD aware and changed discharge status. Will continue to monitor.

## 2015-11-20 NOTE — ED Provider Notes (Signed)
Pt was up for discharge but nurse called me to evaluate for confusion Pt is awake/alert, but is confused with repetitive questioning All workup thus far negative (ct head, urinalysis) She is multiple medications including xanax, baclofen, soma, etc Possible withdrawal? She has no way home as of now Will recheck vitals, give dose of ativan and monitor   Brooke [name] Tabet, MD 11/20/15 0230

## 2015-11-20 NOTE — ED Notes (Signed)
Patient transported to CT 

## 2015-11-20 NOTE — Discharge Instructions (Signed)
"  Limited follow your regular doctor. Workup here today for the full included negative CT of head and neck. Negative x-rays of the pelvis and both hips. Return for any new or worse symptoms.

## 2015-11-21 DIAGNOSIS — I5032 Chronic diastolic (congestive) heart failure: Secondary | ICD-10-CM | POA: Diagnosis not present

## 2015-11-21 DIAGNOSIS — G5791 Unspecified mononeuropathy of right lower limb: Secondary | ICD-10-CM | POA: Diagnosis not present

## 2015-11-21 DIAGNOSIS — I4892 Unspecified atrial flutter: Secondary | ICD-10-CM

## 2015-11-21 DIAGNOSIS — R410 Disorientation, unspecified: Secondary | ICD-10-CM | POA: Diagnosis not present

## 2015-11-21 LAB — BASIC METABOLIC PANEL
ANION GAP: 6 (ref 5–15)
BUN: 11 mg/dL (ref 6–20)
CALCIUM: 8.4 mg/dL — AB (ref 8.9–10.3)
CO2: 24 mmol/L (ref 22–32)
CREATININE: 0.92 mg/dL (ref 0.44–1.00)
Chloride: 110 mmol/L (ref 101–111)
GFR, EST NON AFRICAN AMERICAN: 60 mL/min — AB (ref >60)
GLUCOSE: 85 mg/dL (ref 65–99)
Potassium: 3.7 mmol/L (ref 3.5–5.1)
Sodium: 140 mmol/L (ref 135–145)

## 2015-11-21 LAB — CBC
HCT: 34.1 % — ABNORMAL LOW (ref 36.0–46.0)
HEMOGLOBIN: 11.2 g/dL — AB (ref 12.0–15.0)
MCH: 30.7 pg (ref 26.0–34.0)
MCHC: 32.8 g/dL (ref 30.0–36.0)
MCV: 93.4 fL (ref 78.0–100.0)
PLATELETS: 240 10*3/uL (ref 150–400)
RBC: 3.65 MIL/uL — ABNORMAL LOW (ref 3.87–5.11)
RDW: 13.8 % (ref 11.5–15.5)
WBC: 4.3 10*3/uL (ref 4.0–10.5)

## 2015-11-21 LAB — RPR: RPR Ser Ql: NONREACTIVE

## 2015-11-21 NOTE — Care Management Obs Status (Signed)
MEDICARE OBSERVATION STATUS NOTIFICATION   Patient Details  Name: Brooke CostaMary S Kaneshiro MRN: [id number] Date of Birth: [birth year]   Medicare Observation Status Notification Given:  Yes    Alexis Goodelleele, Suhana Wilner K, RN 11/21/2015, 2:41 PM

## 2015-11-21 NOTE — Discharge Summary (Signed)
Physician Discharge Summary  Brooke [name]:[id number] DOB: Nov 18, 1940 DOA: 11/19/2015  PCP: Gwynneth Aliment, MD  Admit date: 11/19/2015 Discharge date: 12/01/2015  Time spent: 50 minutes    Discharge Condition: stable    Discharge Diagnoses:  Principal Problem:   Delirium Active Problems:   Neuralgia of right foot   Paroxysmal atrial flutter (HCC)   HTN (hypertension), benign   Anxiety state   Benign paroxysmal positional vertigo   Fibromyalgia   GERD (gastroesophageal reflux disease)   HLD (hyperlipidemia)   Fall   Chronic diastolic congestive heart failure (HCC)   History of present illness:  Brooke [name] is a 75 y.o. female with PMH significant for paroxysmal A. Fib (on xarelto), HTN, HLD, GERD, Anxiety/depression, right foot neuropathy and chronic diastolic heart failure; presented to ED secondary to mechanical fall and complaint of hip pain. Patient reprots some dizziness prior to the fall. Has been diagnosed with vertigo and was supposed to be on meclizine. Patient also reported newly starting neurotnin 300 mg TID; even she had changed to twice a day due to feeling worn out and very sleepy (she took 300mg  BID prior to admission).Patient reported some Headache and nausea. Patient after complete evaluation in ED, was initially to be discharged, but was found with acute delirium and poor insight, reason why [facility] was called to admit patient for observation. Of note; she denies CP, SOB, vomiting, dysuria, hematuria, abd pain, Hematemesis, hematochezia and melena.   Hospital Course:  Delirium - determined to be due to Neurontin and has resolved upon stopping it. She will speak with her neurologist about alternative medicaition. May just need to start at 100 BID and titrate up  Dizziness  - also due to Neurontin and resolved - vestibular PT eval was requested but PT did not feel it was needed   Neuralgia of right foot - outpt f/u for treatment of this  Paroxysmal  atrial flutter (HCC): -sinus rhythm while in ED -continue toprol - she states her Xarelto has been discontinued by her doctor -CHADsVASC score 3   HTN (hypertension), benign: -stable -will continue current medication regimen   Anxiety state/depression: -will continue home medication regimen  -dose adjusted given acute delirium  -no SI or hallucinations   Fibromyalgia: -will continue chronic tramadol   GERD (gastroesophageal reflux disease): -will continue PPI   HLD (hyperlipidemia): -continue statins   ischarge Exam: Filed Weights   11/20/15 0827 11/21/15 0547  Weight: 58.1 kg (128 lb 1.4 oz) 58.5 kg (128 lb 15.5 oz)   Filed Vitals:   11/20/15 2123 11/21/15 0547  BP: 124/58 124/57  Pulse: 66 61  Temp: 98.1 F (36.7 C) 98.1 F (36.7 C)  Resp: 16 16    General: AAO x 3, no distress Cardiovascular: RRR, no murmurs  Respiratory: clear to auscultation bilaterally GI: soft, non-tender, non-distended, bowel sound positive  Discharge Instructions You were cared for by a hospitalist during your hospital stay. If you have any questions about your discharge medications or the care you received while you were in the hospital after you are discharged, you can call the unit and asked to speak with the hospitalist on call if the hospitalist that took care of you is not available. Once you are discharged, your primary care physician will handle any further medical issues. Please note that NO REFILLS for any discharge medications will be authorized once you are discharged, as it is imperative that you return to your primary care physician (or establish a relationship with a  primary care physician if you do not have one) for your aftercare needs so that they can reassess your need for medications and monitor your lab values.      Discharge Instructions    Diet - low sodium heart healthy    Complete by:  As directed      Increase activity slowly    Complete by:  As directed              Medication List    STOP taking these medications        carisoprodol 350 MG tablet  Commonly known as:  SOMA     gabapentin 300 MG capsule  Commonly known as:  NEURONTIN     meclizine 25 MG tablet  Commonly known as:  ANTIVERT     rivaroxaban 20 MG Tabs tablet  Commonly known as:  XARELTO      TAKE these medications        alendronate 70 MG tablet  Commonly known as:  FOSAMAX  TAKE 70 MG EVERY WEEK IN THE MORNING AT LEAST 30 MINUTES BEFORE 1ST FOOD/BEVERAGE OF THE DAY     baclofen 20 MG tablet  Commonly known as:  LIORESAL  Take 20 mg by mouth 3 (three) times daily.     citalopram 10 MG tablet  Commonly known as:  CELEXA  Take 10 mg by mouth daily.     esomeprazole 40 MG capsule  Commonly known as:  NEXIUM  Take 40 mg by mouth daily at 12 noon.     fluticasone 50 MCG/ACT nasal spray  Commonly known as:  FLONASE  INHALE 1 SPRAY DAILY IN EACH NOSTRIL     metoprolol succinate 25 MG 24 hr tablet  Commonly known as:  TOPROL-XL  Take 25 mg by mouth daily.     mirtazapine 7.5 MG tablet  Commonly known as:  REMERON  take 7.5mg  at bedtime may take a second dose in 2 hours if not effective     rosuvastatin 10 MG tablet  Commonly known as:  CRESTOR  Take 10 mg by mouth daily. Reported on 11/20/2015     traMADol 50 MG tablet  Commonly known as:  ULTRAM  Take 50 mg by mouth every 6 (six) hours as needed for moderate pain.     traZODone 50 MG tablet  Commonly known as:  DESYREL  Take 50 mg by mouth at bedtime.     Vitamin D (Ergocalciferol) 50000 units Caps capsule  Commonly known as:  DRISDOL  Reported on 11/20/2015     XANAX 0.25 MG tablet  Generic drug:  ALPRAZolam  take 0.25 MG by oral route 2 times every day as needed anxiety       Allergies  Allergen Reactions  . Codeine Nausea And Vomiting  . Demerol [Meperidine] Swelling  . Penicillins Nausea And Vomiting    Has patient had a PCN reaction causing immediate rash, facial/tongue/throat  swelling, SOB or lightheadedness with hypotension: No Has patient had a PCN reaction causing severe rash involving mucus membranes or skin necrosis: No Has patient had a PCN reaction that required hospitalization No Has patient had a PCN reaction occurring within the last 10 years: No If all of the above answers are "NO", then may proceed with Cephalosporin use.   . Tramadol Nausea And Vomiting   Follow-up Information    Schedule an appointment as soon as possible for a visit with Gwynneth AlimentSANDERS,ROBYN N, MD.   Specialty:  Internal Medicine   Contact  information:   [address] [place] [address] [phone]        The results of significant diagnostics from this hospitalization (including imaging, microbiology, ancillary and laboratory) are listed below for reference.    Significant Diagnostic Studies: Dg Chest 2 View  11/20/2015  CLINICAL DATA:  75 year old who fell last night and again this morning due to dizziness which she relates to a new medication. Bilateral hip pain. Current history of hypertension. Patient struck her forehead on the bathroom sink when she fell today. EXAM: CHEST  2 VIEW COMPARISON:  01/09/2011 and earlier, including CTA chest 119 1,012. FINDINGS: AP semi-erect and lateral images were obtained. Cardiac silhouette normal in size, unchanged. Thoracic aorta tortuous and mildly atherosclerotic, unchanged. Hilar and mediastinal contours otherwise unremarkable. Lungs clear. Bronchovascular markings normal. Pulmonary vascularity normal. No visible pleural effusions. No pneumothorax. 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 IMPRESSION: No acute cardiopulmonary disease. Electronically Signed   By: Hulan Saas M.D.   On: 11/20/2015 00:06   Ct Head Wo Contrast  11/20/2015  CLINICAL DATA:  Fall. EXAM: CT HEAD WITHOUT CONTRAST CT CERVICAL SPINE WITHOUT CONTRAST TECHNIQUE: Multidetector CT imaging of the head and cervical spine was  performed following the standard protocol without intravenous contrast. Multiplanar CT image reconstructions of the cervical spine were also generated. COMPARISON:  CT head and cervical spine 10/02/2015. MRI brain 10/18/2015. FINDINGS: CT HEAD FINDINGS Ventricles and sulci appear symmetrical. No ventricular dilatation. No mass effect or midline shift. No abnormal extra-axial fluid collections. Gray-white matter junctions are distinct. Basal cisterns are not effaced. No evidence of acute intracranial hemorrhage. No depressed skull fractures. Visualized paranasal sinuses and mastoid air cells are not opacified. CT CERVICAL SPINE FINDINGS Diffuse degenerative changes in the cervical spine with narrowed interspaces and endplate hypertrophic changes throughout. Degenerative changes are most prominent at C4-5 and C6-7 levels. Prominent disc osteophyte complexes demonstrated at C4-5 and C6-7 encroach upon the anterior central canal. There is slight anterior subluxation of C2 on C3 and of C3 on C4. This is more prominent than was present on the previous study. This may be degenerative but ligamentous injury is not excluded. Correlation with physical examination recommended. MRI would be more sensitive for evaluation of ligamentous structures if ligamentous injury is suspected clinically. 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 Degenerative changes throughout the facet joints. Normal alignment of the facet joints. No prevertebral soft tissue swelling. No vertebral compression deformities. C1-2 articulation appears intact. Vascular calcifications. Soft tissues are unremarkable. IMPRESSION: No acute intracranial abnormalities. Degenerative changes throughout the cervical spine. Slight anterior subluxations at C2-3 and C3-4 are not seen on previous study. This could be degenerative but ligamentous injury is not excluded. No acute displaced fractures identified. Electronically Signed   By: Burman Nieves M.D.    On: 11/20/2015 00:33   Ct Cervical Spine Wo Contrast  11/20/2015  CLINICAL DATA:  Fall. EXAM: CT HEAD WITHOUT CONTRAST CT CERVICAL SPINE WITHOUT CONTRAST TECHNIQUE: Multidetector CT imaging of the head and cervical spine was performed following the standard protocol without intravenous contrast. Multiplanar CT image reconstructions of the cervical spine were also generated. COMPARISON:  CT head and cervical spine 10/02/2015. MRI brain 10/18/2015. FINDINGS: CT HEAD FINDINGS Ventricles and sulci appear symmetrical. No ventricular dilatation. No mass effect or midline shift. No abnormal extra-axial fluid collections. Gray-white matter junctions are distinct. Basal cisterns are not effaced. No evidence of acute intracranial hemorrhage. No depressed skull fractures. Visualized paranasal sinuses and mastoid  air cells are not opacified. CT CERVICAL SPINE FINDINGS Diffuse degenerative changes in the cervical spine with narrowed interspaces and endplate hypertrophic changes throughout. Degenerative changes are most prominent at C4-5 and C6-7 levels. Prominent disc osteophyte complexes demonstrated at C4-5 and C6-7 encroach upon the anterior central canal. There is slight anterior subluxation of C2 on C3 and of C3 on C4. This is more prominent than was present on the previous study. This may be degenerative but ligamentous injury is not excluded. Correlation with physical examination recommended. MRI would be more sensitive for evaluation of ligamentous structures if ligamentous injury is suspected clinically. 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 Degenerative changes throughout the facet joints. Normal alignment of the facet joints. No prevertebral soft tissue swelling. No vertebral compression deformities. C1-2 articulation appears intact. Vascular calcifications. Soft tissues are unremarkable. IMPRESSION: No acute intracranial abnormalities. Degenerative changes throughout the cervical spine. Slight  anterior subluxations at C2-3 and C3-4 are not seen on previous study. This could be degenerative but ligamentous injury is not excluded. No acute displaced fractures identified. Electronically Signed   By: Burman Nieves M.D.   On: 11/20/2015 00:33   Dg Hips Bilat With Pelvis 3-4 Views  11/20/2015  CLINICAL DATA:  75 year old who fell last night and again this morning due to dizziness which she relates to a new medication. Bilateral hip pain. Current history of hypertension. Patient struck her forehead on the bathroom sink when she fell today. EXAM: DG HIP (WITH OR WITHOUT PELVIS) 3-4V BILAT COMPARISON:  None. FINDINGS: No acute fractures involving the bony pelvis or either proximal femur. Both hip joints anatomically aligned with well-preserved joint spaces. Sacroiliac joints and symphysis pubis intact. Bone mineral density well-preserved for age. Prior L3 through L5 fusion and posterior decompression. IMPRESSION: No acute osseous abnormalities. Electronically Signed   By: Hulan Saas M.D.   On: 11/20/2015 00:08    Microbiology: No results found for this or any previous visit (from the past 240 hour(s)).   Labs: Basic Metabolic Panel: No results for input(s): NA, K, CL, CO2, GLUCOSE, BUN, CREATININE, CALCIUM, MG, PHOS in the last 168 hours. Liver Function Tests: No results for input(s): AST, ALT, ALKPHOS, BILITOT, PROT, ALBUMIN in the last 168 hours. No results for input(s): LIPASE, AMYLASE in the last 168 hours. No results for input(s): AMMONIA in the last 168 hours. CBC: No results for input(s): WBC, NEUTROABS, HGB, HCT, MCV, PLT in the last 168 hours. Cardiac Enzymes: No results for input(s): CKTOTAL, CKMB, CKMBINDEX, TROPONINI in the last 168 hours. BNP: BNP (last 3 results) No results for input(s): BNP in the last 8760 hours.  ProBNP (last 3 results) No results for input(s): PROBNP in the last 8760 hours.  CBG: No results for input(s): GLUCAP in the last 168  hours.     SignedCalvert Cantor, MD [facility] 12/01/2015, 4:19 PM

## 2015-11-21 NOTE — Progress Notes (Signed)
Discharge instructions given to pt, verbalized understanding. Left the unit in stable condition. 

## 2015-11-21 NOTE — Evaluation (Signed)
Physical Therapy Evaluation Patient Details Name: Brooke CostaMary S Cheese MRN: [id number] DOB: 1941/01/05 Today's Date: 11/21/2015   History of Present Illness  75 y.o. female with PMH significant for paroxysmal A. Fib (on xarelto), HTN, HLD, GERD, Anxiety/depression, right foot neuropathy and chronic diastolic heart failure; presented to ED secondary to mechanical fall and complaint of hip pain.  Imaging negative for acute abnormalities.  Clinical Impression  Patient evaluated by Physical Therapy with no further acute PT needs identified. All education has been completed and the patient has no further questions. Pt reports no dizziness or vertigo symptoms today and mobilized well so did not perform full vestibular exam.  Pt reports she believes dizziness was from gabapentin. See below for any follow-up Physical Therapy or equipment needs. PT is signing off. Thank you for this referral.     Follow Up Recommendations No PT follow up    Equipment Recommendations  None recommended by PT    Recommendations for Other Services       Precautions / Restrictions Precautions Precautions: Fall      Mobility  Bed Mobility Overal bed mobility: Modified Independent                Transfers Overall transfer level: Modified independent                  Ambulation/Gait Ambulation/Gait assistance: Supervision;Modified independent (Device/Increase time) Ambulation Distance (Feet): 360 Feet Assistive device: None Gait Pattern/deviations: WFL(Within Functional Limits)     General Gait Details: no LOB or unsteadiness observed during ambulation, pt denies any symptoms  Stairs            Wheelchair Mobility    Modified Rankin (Stroke Patients Only)       Balance Overall balance assessment: History of Falls                           High level balance activites: Turns;Sudden stops;Head turns High Level Balance Comments: no difficulty with above activities              Pertinent Vitals/Pain Pain Assessment: No/denies pain    Home Living Family/patient expects to be discharged to:: Private residence Living Arrangements: Alone Available Help at Discharge: Family Type of Home: House Home Access: Level entry     Home Layout: One level Home Equipment: Environmental consultantWalker - 2 wheels      Prior Function Level of Independence: Independent               Hand Dominance        Extremity/Trunk Assessment               Lower Extremity Assessment: Overall WFL for tasks assessed (denies numbness/tingling, reports she does have arthritis)         Communication   Communication: No difficulties  Cognition Arousal/Alertness: Awake/alert Behavior During Therapy: WFL for tasks assessed/performed Overall Cognitive Status: Within Functional Limits for tasks assessed                      General Comments      Exercises        Assessment/Plan    PT Assessment Patent does not need any further PT services  PT Diagnosis Difficulty walking   PT Problem List    PT Treatment Interventions     PT Goals (Current goals can be found in the Care Plan section) Acute Rehab PT Goals PT Goal Formulation: All assessment and  education complete, DC therapy    Frequency     Barriers to discharge        Co-evaluation               End of Session Equipment Utilized During Treatment: Gait belt Activity Tolerance: Patient tolerated treatment well Patient left: in bed;with call bell/phone within reach;with family/visitor present Nurse Communication: Mobility status    Functional Assessment Tool Used: clinical judgement Functional Limitation: Mobility: Walking and moving around Mobility: Walking and Moving Around Current Status [phone]): 0 percent impaired, limited or restricted Mobility: Walking and Moving Around Goal Status [phone]): 0 percent impaired, limited or restricted Mobility: Walking and Moving Around Discharge Status  [phone]): 0 percent impaired, limited or restricted    Time: 1358-1409 PT Time Calculation (min) (ACUTE ONLY): 11 min   Charges:   PT Evaluation $PT Eval Low Complexity: 1 Procedure     PT G Codes:   PT G-Codes **NOT FOR INPATIENT CLASS** Functional Assessment Tool Used: clinical judgement Functional Limitation: Mobility: Walking and moving around Mobility: Walking and Moving Around Current Status (B1478): 0 percent impaired, limited or restricted Mobility: Walking and Moving Around Goal Status (G9562): 0 percent impaired, limited or restricted Mobility: Walking and Moving Around Discharge Status (Z3086): 0 percent impaired, limited or restricted    Shruthi Northrup,KATHrine E 11/21/2015, 3:36 PM Zenovia Jarred, PT, DPT 11/21/2015 Pager: [phone]

## 2015-12-08 DIAGNOSIS — H2513 Age-related nuclear cataract, bilateral: Secondary | ICD-10-CM | POA: Diagnosis not present

## 2016-01-11 DIAGNOSIS — F411 Generalized anxiety disorder: Secondary | ICD-10-CM | POA: Diagnosis not present

## 2016-01-11 DIAGNOSIS — M2550 Pain in unspecified joint: Secondary | ICD-10-CM | POA: Diagnosis not present

## 2016-02-27 DIAGNOSIS — I4892 Unspecified atrial flutter: Secondary | ICD-10-CM | POA: Diagnosis not present

## 2016-02-27 DIAGNOSIS — E785 Hyperlipidemia, unspecified: Secondary | ICD-10-CM | POA: Diagnosis not present

## 2016-04-27 DIAGNOSIS — Z885 Allergy status to narcotic agent status: Secondary | ICD-10-CM | POA: Diagnosis not present

## 2016-04-27 DIAGNOSIS — Z7982 Long term (current) use of aspirin: Secondary | ICD-10-CM | POA: Diagnosis not present

## 2016-04-27 DIAGNOSIS — M25532 Pain in left wrist: Secondary | ICD-10-CM | POA: Diagnosis not present

## 2016-04-27 DIAGNOSIS — M7989 Other specified soft tissue disorders: Secondary | ICD-10-CM | POA: Diagnosis not present

## 2016-04-27 DIAGNOSIS — Z888 Allergy status to other drugs, medicaments and biological substances status: Secondary | ICD-10-CM | POA: Diagnosis not present

## 2016-04-27 DIAGNOSIS — E7800 Pure hypercholesterolemia, unspecified: Secondary | ICD-10-CM | POA: Diagnosis not present

## 2016-04-27 DIAGNOSIS — M542 Cervicalgia: Secondary | ICD-10-CM | POA: Diagnosis not present

## 2016-04-27 DIAGNOSIS — Z79899 Other long term (current) drug therapy: Secondary | ICD-10-CM | POA: Diagnosis not present

## 2016-04-27 DIAGNOSIS — Z880 Allergy status to penicillin: Secondary | ICD-10-CM | POA: Diagnosis not present

## 2016-04-27 DIAGNOSIS — M19032 Primary osteoarthritis, left wrist: Secondary | ICD-10-CM | POA: Diagnosis not present

## 2016-04-27 DIAGNOSIS — K219 Gastro-esophageal reflux disease without esophagitis: Secondary | ICD-10-CM | POA: Diagnosis not present

## 2016-05-02 DIAGNOSIS — M791 Myalgia: Secondary | ICD-10-CM | POA: Diagnosis not present

## 2016-05-02 DIAGNOSIS — R768 Other specified abnormal immunological findings in serum: Secondary | ICD-10-CM | POA: Diagnosis not present

## 2016-05-02 DIAGNOSIS — M2550 Pain in unspecified joint: Secondary | ICD-10-CM | POA: Diagnosis not present

## 2016-05-02 DIAGNOSIS — M25539 Pain in unspecified wrist: Secondary | ICD-10-CM | POA: Diagnosis not present

## 2016-05-02 DIAGNOSIS — Z23 Encounter for immunization: Secondary | ICD-10-CM | POA: Diagnosis not present

## 2016-05-10 DIAGNOSIS — M25539 Pain in unspecified wrist: Secondary | ICD-10-CM | POA: Diagnosis not present

## 2016-05-10 DIAGNOSIS — R768 Other specified abnormal immunological findings in serum: Secondary | ICD-10-CM | POA: Diagnosis not present

## 2016-05-10 DIAGNOSIS — M2550 Pain in unspecified joint: Secondary | ICD-10-CM | POA: Diagnosis not present

## 2016-05-10 DIAGNOSIS — M791 Myalgia: Secondary | ICD-10-CM | POA: Diagnosis not present

## 2016-06-04 DIAGNOSIS — R110 Nausea: Secondary | ICD-10-CM | POA: Diagnosis not present

## 2016-06-04 DIAGNOSIS — J209 Acute bronchitis, unspecified: Secondary | ICD-10-CM | POA: Diagnosis not present

## 2016-07-11 DIAGNOSIS — Z1231 Encounter for screening mammogram for malignant neoplasm of breast: Secondary | ICD-10-CM | POA: Diagnosis not present

## 2016-07-11 DIAGNOSIS — G2581 Restless legs syndrome: Secondary | ICD-10-CM | POA: Diagnosis not present

## 2016-07-11 DIAGNOSIS — Z23 Encounter for immunization: Secondary | ICD-10-CM | POA: Diagnosis not present

## 2016-07-11 DIAGNOSIS — Z1211 Encounter for screening for malignant neoplasm of colon: Secondary | ICD-10-CM | POA: Diagnosis not present

## 2016-07-11 DIAGNOSIS — M2550 Pain in unspecified joint: Secondary | ICD-10-CM | POA: Diagnosis not present

## 2016-07-11 DIAGNOSIS — H269 Unspecified cataract: Secondary | ICD-10-CM | POA: Diagnosis not present

## 2016-07-11 DIAGNOSIS — F329 Major depressive disorder, single episode, unspecified: Secondary | ICD-10-CM | POA: Diagnosis not present

## 2016-07-11 DIAGNOSIS — I10 Essential (primary) hypertension: Secondary | ICD-10-CM | POA: Diagnosis not present

## 2016-07-11 DIAGNOSIS — Z0000 Encounter for general adult medical examination without abnormal findings: Secondary | ICD-10-CM | POA: Diagnosis not present

## 2016-09-04 DIAGNOSIS — E785 Hyperlipidemia, unspecified: Secondary | ICD-10-CM | POA: Diagnosis not present

## 2016-09-04 DIAGNOSIS — I4892 Unspecified atrial flutter: Secondary | ICD-10-CM | POA: Diagnosis not present

## 2016-10-30 DIAGNOSIS — M25539 Pain in unspecified wrist: Secondary | ICD-10-CM | POA: Diagnosis not present

## 2016-10-30 DIAGNOSIS — M810 Age-related osteoporosis without current pathological fracture: Secondary | ICD-10-CM | POA: Diagnosis not present

## 2016-10-30 DIAGNOSIS — M791 Myalgia: Secondary | ICD-10-CM | POA: Diagnosis not present

## 2016-10-30 DIAGNOSIS — M2550 Pain in unspecified joint: Secondary | ICD-10-CM | POA: Diagnosis not present

## 2016-10-30 DIAGNOSIS — R768 Other specified abnormal immunological findings in serum: Secondary | ICD-10-CM | POA: Diagnosis not present

## 2017-01-25 DIAGNOSIS — K219 Gastro-esophageal reflux disease without esophagitis: Secondary | ICD-10-CM | POA: Diagnosis not present

## 2017-01-25 DIAGNOSIS — G4700 Insomnia, unspecified: Secondary | ICD-10-CM | POA: Diagnosis not present

## 2017-01-25 DIAGNOSIS — F411 Generalized anxiety disorder: Secondary | ICD-10-CM | POA: Diagnosis not present

## 2017-01-25 DIAGNOSIS — M542 Cervicalgia: Secondary | ICD-10-CM | POA: Diagnosis not present

## 2017-03-25 DIAGNOSIS — M25561 Pain in right knee: Secondary | ICD-10-CM | POA: Diagnosis not present

## 2017-03-25 DIAGNOSIS — M79643 Pain in unspecified hand: Secondary | ICD-10-CM | POA: Diagnosis not present

## 2017-03-25 DIAGNOSIS — M542 Cervicalgia: Secondary | ICD-10-CM | POA: Diagnosis not present

## 2017-03-25 DIAGNOSIS — M25519 Pain in unspecified shoulder: Secondary | ICD-10-CM | POA: Diagnosis not present

## 2017-03-25 DIAGNOSIS — M79671 Pain in right foot: Secondary | ICD-10-CM | POA: Diagnosis not present

## 2017-03-25 DIAGNOSIS — G894 Chronic pain syndrome: Secondary | ICD-10-CM | POA: Diagnosis not present

## 2017-04-09 DIAGNOSIS — M542 Cervicalgia: Secondary | ICD-10-CM | POA: Diagnosis not present

## 2017-04-09 DIAGNOSIS — R600 Localized edema: Secondary | ICD-10-CM | POA: Diagnosis not present

## 2017-04-09 DIAGNOSIS — Z23 Encounter for immunization: Secondary | ICD-10-CM | POA: Diagnosis not present

## 2017-04-09 DIAGNOSIS — F411 Generalized anxiety disorder: Secondary | ICD-10-CM | POA: Diagnosis not present

## 2017-04-09 DIAGNOSIS — G43109 Migraine with aura, not intractable, without status migrainosus: Secondary | ICD-10-CM | POA: Diagnosis not present

## 2017-04-09 DIAGNOSIS — G4700 Insomnia, unspecified: Secondary | ICD-10-CM | POA: Diagnosis not present

## 2017-04-09 DIAGNOSIS — R609 Edema, unspecified: Secondary | ICD-10-CM | POA: Diagnosis not present

## 2017-04-11 DIAGNOSIS — R600 Localized edema: Secondary | ICD-10-CM | POA: Diagnosis not present

## 2017-04-15 DIAGNOSIS — R0602 Shortness of breath: Secondary | ICD-10-CM | POA: Diagnosis not present

## 2017-04-15 DIAGNOSIS — I4892 Unspecified atrial flutter: Secondary | ICD-10-CM | POA: Diagnosis not present

## 2017-04-15 DIAGNOSIS — R002 Palpitations: Secondary | ICD-10-CM | POA: Diagnosis not present

## 2017-04-19 DIAGNOSIS — R0602 Shortness of breath: Secondary | ICD-10-CM | POA: Diagnosis not present

## 2017-04-19 DIAGNOSIS — R002 Palpitations: Secondary | ICD-10-CM | POA: Diagnosis not present

## 2017-04-22 DIAGNOSIS — M542 Cervicalgia: Secondary | ICD-10-CM | POA: Diagnosis not present

## 2017-04-22 DIAGNOSIS — M25519 Pain in unspecified shoulder: Secondary | ICD-10-CM | POA: Diagnosis not present

## 2017-04-22 DIAGNOSIS — M79643 Pain in unspecified hand: Secondary | ICD-10-CM | POA: Diagnosis not present

## 2017-04-22 DIAGNOSIS — G894 Chronic pain syndrome: Secondary | ICD-10-CM | POA: Diagnosis not present

## 2017-04-28 DIAGNOSIS — R002 Palpitations: Secondary | ICD-10-CM | POA: Diagnosis not present

## 2017-04-29 DIAGNOSIS — Z7982 Long term (current) use of aspirin: Secondary | ICD-10-CM | POA: Diagnosis not present

## 2017-04-29 DIAGNOSIS — E7800 Pure hypercholesterolemia, unspecified: Secondary | ICD-10-CM | POA: Diagnosis not present

## 2017-04-29 DIAGNOSIS — M50322 Other cervical disc degeneration at C5-C6 level: Secondary | ICD-10-CM | POA: Diagnosis not present

## 2017-04-29 DIAGNOSIS — S199XXA Unspecified injury of neck, initial encounter: Secondary | ICD-10-CM | POA: Diagnosis not present

## 2017-04-29 DIAGNOSIS — S99911A Unspecified injury of right ankle, initial encounter: Secondary | ICD-10-CM | POA: Diagnosis not present

## 2017-04-29 DIAGNOSIS — M50321 Other cervical disc degeneration at C4-C5 level: Secondary | ICD-10-CM | POA: Diagnosis not present

## 2017-04-29 DIAGNOSIS — S8001XA Contusion of right knee, initial encounter: Secondary | ICD-10-CM | POA: Diagnosis not present

## 2017-04-29 DIAGNOSIS — S93401A Sprain of unspecified ligament of right ankle, initial encounter: Secondary | ICD-10-CM | POA: Diagnosis not present

## 2017-04-29 DIAGNOSIS — Z96651 Presence of right artificial knee joint: Secondary | ICD-10-CM | POA: Diagnosis not present

## 2017-04-29 DIAGNOSIS — M85871 Other specified disorders of bone density and structure, right ankle and foot: Secondary | ICD-10-CM | POA: Diagnosis not present

## 2017-04-29 DIAGNOSIS — M25561 Pain in right knee: Secondary | ICD-10-CM | POA: Diagnosis not present

## 2017-04-29 DIAGNOSIS — Z886 Allergy status to analgesic agent status: Secondary | ICD-10-CM | POA: Diagnosis not present

## 2017-04-29 DIAGNOSIS — Z880 Allergy status to penicillin: Secondary | ICD-10-CM | POA: Diagnosis not present

## 2017-04-29 DIAGNOSIS — M7989 Other specified soft tissue disorders: Secondary | ICD-10-CM | POA: Diagnosis not present

## 2017-04-29 DIAGNOSIS — Z888 Allergy status to other drugs, medicaments and biological substances status: Secondary | ICD-10-CM | POA: Diagnosis not present

## 2017-04-29 DIAGNOSIS — K219 Gastro-esophageal reflux disease without esophagitis: Secondary | ICD-10-CM | POA: Diagnosis not present

## 2017-04-29 DIAGNOSIS — M542 Cervicalgia: Secondary | ICD-10-CM | POA: Diagnosis not present

## 2017-04-29 DIAGNOSIS — S161XXA Strain of muscle, fascia and tendon at neck level, initial encounter: Secondary | ICD-10-CM | POA: Diagnosis not present

## 2017-04-29 DIAGNOSIS — W010XXA Fall on same level from slipping, tripping and stumbling without subsequent striking against object, initial encounter: Secondary | ICD-10-CM | POA: Diagnosis not present

## 2017-04-29 DIAGNOSIS — M7731 Calcaneal spur, right foot: Secondary | ICD-10-CM | POA: Diagnosis not present

## 2017-04-29 DIAGNOSIS — Z79899 Other long term (current) drug therapy: Secondary | ICD-10-CM | POA: Diagnosis not present

## 2017-04-29 DIAGNOSIS — Z885 Allergy status to narcotic agent status: Secondary | ICD-10-CM | POA: Diagnosis not present

## 2017-06-14 DIAGNOSIS — G4700 Insomnia, unspecified: Secondary | ICD-10-CM | POA: Diagnosis not present

## 2017-07-15 DIAGNOSIS — M25519 Pain in unspecified shoulder: Secondary | ICD-10-CM | POA: Diagnosis not present

## 2017-07-15 DIAGNOSIS — M542 Cervicalgia: Secondary | ICD-10-CM | POA: Diagnosis not present

## 2017-07-15 DIAGNOSIS — G894 Chronic pain syndrome: Secondary | ICD-10-CM | POA: Diagnosis not present

## 2017-07-15 DIAGNOSIS — M79643 Pain in unspecified hand: Secondary | ICD-10-CM | POA: Diagnosis not present

## 2017-07-29 DIAGNOSIS — S4991XA Unspecified injury of right shoulder and upper arm, initial encounter: Secondary | ICD-10-CM | POA: Diagnosis not present

## 2017-07-29 DIAGNOSIS — Z79899 Other long term (current) drug therapy: Secondary | ICD-10-CM | POA: Diagnosis not present

## 2017-07-29 DIAGNOSIS — Z880 Allergy status to penicillin: Secondary | ICD-10-CM | POA: Diagnosis not present

## 2017-07-29 DIAGNOSIS — M25511 Pain in right shoulder: Secondary | ICD-10-CM | POA: Diagnosis not present

## 2017-07-29 DIAGNOSIS — W0110XA Fall on same level from slipping, tripping and stumbling with subsequent striking against unspecified object, initial encounter: Secondary | ICD-10-CM | POA: Diagnosis not present

## 2017-07-29 DIAGNOSIS — S46911A Strain of unspecified muscle, fascia and tendon at shoulder and upper arm level, right arm, initial encounter: Secondary | ICD-10-CM | POA: Diagnosis not present

## 2017-07-29 DIAGNOSIS — M19011 Primary osteoarthritis, right shoulder: Secondary | ICD-10-CM | POA: Diagnosis not present

## 2017-07-29 DIAGNOSIS — M779 Enthesopathy, unspecified: Secondary | ICD-10-CM | POA: Diagnosis not present

## 2017-07-29 DIAGNOSIS — Z7982 Long term (current) use of aspirin: Secondary | ICD-10-CM | POA: Diagnosis not present

## 2017-07-29 DIAGNOSIS — Z885 Allergy status to narcotic agent status: Secondary | ICD-10-CM | POA: Diagnosis not present

## 2017-07-29 DIAGNOSIS — Z791 Long term (current) use of non-steroidal anti-inflammatories (NSAID): Secondary | ICD-10-CM | POA: Diagnosis not present

## 2017-08-05 DIAGNOSIS — Z9109 Other allergy status, other than to drugs and biological substances: Secondary | ICD-10-CM | POA: Diagnosis not present

## 2017-08-05 DIAGNOSIS — G4700 Insomnia, unspecified: Secondary | ICD-10-CM | POA: Diagnosis not present

## 2017-08-05 DIAGNOSIS — M25511 Pain in right shoulder: Secondary | ICD-10-CM | POA: Diagnosis not present

## 2017-08-12 DIAGNOSIS — G894 Chronic pain syndrome: Secondary | ICD-10-CM | POA: Diagnosis not present

## 2017-08-12 DIAGNOSIS — M79643 Pain in unspecified hand: Secondary | ICD-10-CM | POA: Diagnosis not present

## 2017-08-12 DIAGNOSIS — M542 Cervicalgia: Secondary | ICD-10-CM | POA: Diagnosis not present

## 2017-08-12 DIAGNOSIS — M25511 Pain in right shoulder: Secondary | ICD-10-CM | POA: Diagnosis not present

## 2017-08-14 DIAGNOSIS — M7551 Bursitis of right shoulder: Secondary | ICD-10-CM | POA: Diagnosis not present

## 2017-08-27 DIAGNOSIS — R6889 Other general symptoms and signs: Secondary | ICD-10-CM | POA: Diagnosis not present

## 2017-08-27 DIAGNOSIS — J189 Pneumonia, unspecified organism: Secondary | ICD-10-CM | POA: Diagnosis not present

## 2017-09-09 DIAGNOSIS — M542 Cervicalgia: Secondary | ICD-10-CM | POA: Diagnosis not present

## 2017-09-09 DIAGNOSIS — G894 Chronic pain syndrome: Secondary | ICD-10-CM | POA: Diagnosis not present

## 2017-09-09 DIAGNOSIS — Z79891 Long term (current) use of opiate analgesic: Secondary | ICD-10-CM | POA: Diagnosis not present

## 2017-09-09 DIAGNOSIS — M25511 Pain in right shoulder: Secondary | ICD-10-CM | POA: Diagnosis not present

## 2017-09-09 DIAGNOSIS — M79643 Pain in unspecified hand: Secondary | ICD-10-CM | POA: Diagnosis not present

## 2017-09-11 DIAGNOSIS — M7551 Bursitis of right shoulder: Secondary | ICD-10-CM | POA: Diagnosis not present

## 2017-10-01 DIAGNOSIS — G4700 Insomnia, unspecified: Secondary | ICD-10-CM | POA: Diagnosis not present

## 2017-10-01 DIAGNOSIS — F411 Generalized anxiety disorder: Secondary | ICD-10-CM | POA: Diagnosis not present

## 2017-10-01 DIAGNOSIS — F329 Major depressive disorder, single episode, unspecified: Secondary | ICD-10-CM | POA: Diagnosis not present

## 2017-10-03 DIAGNOSIS — M25511 Pain in right shoulder: Secondary | ICD-10-CM | POA: Diagnosis not present

## 2017-10-03 DIAGNOSIS — M75111 Incomplete rotator cuff tear or rupture of right shoulder, not specified as traumatic: Secondary | ICD-10-CM | POA: Diagnosis not present

## 2017-10-03 DIAGNOSIS — R600 Localized edema: Secondary | ICD-10-CM | POA: Diagnosis not present

## 2017-10-03 DIAGNOSIS — M84311A Stress fracture, right shoulder, initial encounter for fracture: Secondary | ICD-10-CM | POA: Diagnosis not present

## 2017-10-03 DIAGNOSIS — M7581 Other shoulder lesions, right shoulder: Secondary | ICD-10-CM | POA: Diagnosis not present

## 2017-10-03 DIAGNOSIS — M66321 Spontaneous rupture of flexor tendons, right upper arm: Secondary | ICD-10-CM | POA: Diagnosis not present

## 2017-10-03 DIAGNOSIS — M7551 Bursitis of right shoulder: Secondary | ICD-10-CM | POA: Diagnosis not present

## 2017-10-07 DIAGNOSIS — G894 Chronic pain syndrome: Secondary | ICD-10-CM | POA: Diagnosis not present

## 2017-10-07 DIAGNOSIS — M25511 Pain in right shoulder: Secondary | ICD-10-CM | POA: Diagnosis not present

## 2017-10-07 DIAGNOSIS — Z79891 Long term (current) use of opiate analgesic: Secondary | ICD-10-CM | POA: Diagnosis not present

## 2017-10-07 DIAGNOSIS — M79643 Pain in unspecified hand: Secondary | ICD-10-CM | POA: Diagnosis not present

## 2017-10-07 DIAGNOSIS — M542 Cervicalgia: Secondary | ICD-10-CM | POA: Diagnosis not present

## 2017-10-25 DIAGNOSIS — R110 Nausea: Secondary | ICD-10-CM | POA: Diagnosis not present

## 2017-10-25 DIAGNOSIS — R062 Wheezing: Secondary | ICD-10-CM | POA: Diagnosis not present

## 2017-10-25 DIAGNOSIS — J209 Acute bronchitis, unspecified: Secondary | ICD-10-CM | POA: Diagnosis not present

## 2017-10-25 DIAGNOSIS — J0110 Acute frontal sinusitis, unspecified: Secondary | ICD-10-CM | POA: Diagnosis not present

## 2017-10-25 DIAGNOSIS — R05 Cough: Secondary | ICD-10-CM | POA: Diagnosis not present

## 2017-11-02 DIAGNOSIS — K529 Noninfective gastroenteritis and colitis, unspecified: Secondary | ICD-10-CM | POA: Diagnosis not present

## 2017-11-02 DIAGNOSIS — R1110 Vomiting, unspecified: Secondary | ICD-10-CM | POA: Diagnosis not present

## 2017-11-02 DIAGNOSIS — Z885 Allergy status to narcotic agent status: Secondary | ICD-10-CM | POA: Diagnosis not present

## 2017-11-02 DIAGNOSIS — K449 Diaphragmatic hernia without obstruction or gangrene: Secondary | ICD-10-CM | POA: Diagnosis not present

## 2017-11-02 DIAGNOSIS — Z7952 Long term (current) use of systemic steroids: Secondary | ICD-10-CM | POA: Diagnosis not present

## 2017-11-02 DIAGNOSIS — Z79899 Other long term (current) drug therapy: Secondary | ICD-10-CM | POA: Diagnosis not present

## 2017-11-02 DIAGNOSIS — Z7982 Long term (current) use of aspirin: Secondary | ICD-10-CM | POA: Diagnosis not present

## 2017-11-02 DIAGNOSIS — E7800 Pure hypercholesterolemia, unspecified: Secondary | ICD-10-CM | POA: Diagnosis not present

## 2017-11-02 DIAGNOSIS — R51 Headache: Secondary | ICD-10-CM | POA: Diagnosis not present

## 2017-11-02 DIAGNOSIS — R000 Tachycardia, unspecified: Secondary | ICD-10-CM | POA: Diagnosis not present

## 2017-11-02 DIAGNOSIS — R109 Unspecified abdominal pain: Secondary | ICD-10-CM | POA: Diagnosis not present

## 2017-11-02 DIAGNOSIS — R9431 Abnormal electrocardiogram [ECG] [EKG]: Secondary | ICD-10-CM | POA: Diagnosis not present

## 2017-11-02 DIAGNOSIS — N281 Cyst of kidney, acquired: Secondary | ICD-10-CM | POA: Diagnosis not present

## 2017-11-02 DIAGNOSIS — Z791 Long term (current) use of non-steroidal anti-inflammatories (NSAID): Secondary | ICD-10-CM | POA: Diagnosis not present

## 2017-11-02 DIAGNOSIS — R932 Abnormal findings on diagnostic imaging of liver and biliary tract: Secondary | ICD-10-CM | POA: Diagnosis not present

## 2017-11-02 DIAGNOSIS — R1111 Vomiting without nausea: Secondary | ICD-10-CM | POA: Diagnosis not present

## 2017-11-02 DIAGNOSIS — Z880 Allergy status to penicillin: Secondary | ICD-10-CM | POA: Diagnosis not present

## 2017-11-02 DIAGNOSIS — R110 Nausea: Secondary | ICD-10-CM | POA: Diagnosis not present

## 2017-11-02 DIAGNOSIS — R197 Diarrhea, unspecified: Secondary | ICD-10-CM | POA: Diagnosis not present

## 2017-11-02 DIAGNOSIS — K219 Gastro-esophageal reflux disease without esophagitis: Secondary | ICD-10-CM | POA: Diagnosis not present

## 2017-11-03 DIAGNOSIS — R109 Unspecified abdominal pain: Secondary | ICD-10-CM | POA: Diagnosis not present

## 2017-11-03 DIAGNOSIS — K449 Diaphragmatic hernia without obstruction or gangrene: Secondary | ICD-10-CM | POA: Diagnosis not present

## 2017-11-03 DIAGNOSIS — R51 Headache: Secondary | ICD-10-CM | POA: Diagnosis not present

## 2017-11-03 DIAGNOSIS — N281 Cyst of kidney, acquired: Secondary | ICD-10-CM | POA: Diagnosis not present

## 2017-11-03 DIAGNOSIS — R932 Abnormal findings on diagnostic imaging of liver and biliary tract: Secondary | ICD-10-CM | POA: Diagnosis not present

## 2017-11-06 DIAGNOSIS — M79643 Pain in unspecified hand: Secondary | ICD-10-CM | POA: Diagnosis not present

## 2017-11-06 DIAGNOSIS — M542 Cervicalgia: Secondary | ICD-10-CM | POA: Diagnosis not present

## 2017-11-06 DIAGNOSIS — G894 Chronic pain syndrome: Secondary | ICD-10-CM | POA: Diagnosis not present

## 2017-11-06 DIAGNOSIS — M25511 Pain in right shoulder: Secondary | ICD-10-CM | POA: Diagnosis not present

## 2017-11-06 DIAGNOSIS — Z79891 Long term (current) use of opiate analgesic: Secondary | ICD-10-CM | POA: Diagnosis not present

## 2017-11-13 DIAGNOSIS — S46011A Strain of muscle(s) and tendon(s) of the rotator cuff of right shoulder, initial encounter: Secondary | ICD-10-CM | POA: Diagnosis not present

## 2017-12-04 DIAGNOSIS — M542 Cervicalgia: Secondary | ICD-10-CM | POA: Diagnosis not present

## 2017-12-04 DIAGNOSIS — M79643 Pain in unspecified hand: Secondary | ICD-10-CM | POA: Diagnosis not present

## 2017-12-04 DIAGNOSIS — M25511 Pain in right shoulder: Secondary | ICD-10-CM | POA: Diagnosis not present

## 2017-12-04 DIAGNOSIS — G894 Chronic pain syndrome: Secondary | ICD-10-CM | POA: Diagnosis not present

## 2017-12-04 DIAGNOSIS — Z79891 Long term (current) use of opiate analgesic: Secondary | ICD-10-CM | POA: Diagnosis not present

## 2017-12-12 DIAGNOSIS — M7541 Impingement syndrome of right shoulder: Secondary | ICD-10-CM | POA: Diagnosis not present

## 2017-12-12 DIAGNOSIS — G8929 Other chronic pain: Secondary | ICD-10-CM | POA: Diagnosis not present

## 2017-12-12 DIAGNOSIS — Z880 Allergy status to penicillin: Secondary | ICD-10-CM | POA: Diagnosis not present

## 2017-12-12 DIAGNOSIS — M7551 Bursitis of right shoulder: Secondary | ICD-10-CM | POA: Diagnosis not present

## 2017-12-12 DIAGNOSIS — M25811 Other specified joint disorders, right shoulder: Secondary | ICD-10-CM | POA: Diagnosis not present

## 2017-12-12 DIAGNOSIS — Z5333 Arthroscopic surgical procedure converted to open procedure: Secondary | ICD-10-CM | POA: Diagnosis not present

## 2017-12-12 DIAGNOSIS — K219 Gastro-esophageal reflux disease without esophagitis: Secondary | ICD-10-CM | POA: Diagnosis not present

## 2017-12-12 DIAGNOSIS — S46011A Strain of muscle(s) and tendon(s) of the rotator cuff of right shoulder, initial encounter: Secondary | ICD-10-CM | POA: Diagnosis not present

## 2017-12-12 DIAGNOSIS — M19011 Primary osteoarthritis, right shoulder: Secondary | ICD-10-CM | POA: Diagnosis not present

## 2017-12-12 DIAGNOSIS — E7800 Pure hypercholesterolemia, unspecified: Secondary | ICD-10-CM | POA: Diagnosis not present

## 2017-12-12 DIAGNOSIS — S46211A Strain of muscle, fascia and tendon of other parts of biceps, right arm, initial encounter: Secondary | ICD-10-CM | POA: Diagnosis not present

## 2017-12-12 DIAGNOSIS — Z885 Allergy status to narcotic agent status: Secondary | ICD-10-CM | POA: Diagnosis not present

## 2017-12-12 DIAGNOSIS — S46111A Strain of muscle, fascia and tendon of long head of biceps, right arm, initial encounter: Secondary | ICD-10-CM | POA: Diagnosis not present

## 2017-12-12 DIAGNOSIS — Z7982 Long term (current) use of aspirin: Secondary | ICD-10-CM | POA: Diagnosis not present

## 2017-12-12 DIAGNOSIS — G8918 Other acute postprocedural pain: Secondary | ICD-10-CM | POA: Diagnosis not present

## 2017-12-12 DIAGNOSIS — F0390 Unspecified dementia without behavioral disturbance: Secondary | ICD-10-CM | POA: Diagnosis not present

## 2017-12-25 DIAGNOSIS — Z4789 Encounter for other orthopedic aftercare: Secondary | ICD-10-CM | POA: Diagnosis not present

## 2017-12-30 DIAGNOSIS — M79601 Pain in right arm: Secondary | ICD-10-CM | POA: Diagnosis not present

## 2017-12-30 DIAGNOSIS — M62838 Other muscle spasm: Secondary | ICD-10-CM | POA: Diagnosis not present

## 2018-01-08 DIAGNOSIS — G894 Chronic pain syndrome: Secondary | ICD-10-CM | POA: Diagnosis not present

## 2018-01-08 DIAGNOSIS — M542 Cervicalgia: Secondary | ICD-10-CM | POA: Diagnosis not present

## 2018-01-08 DIAGNOSIS — M25511 Pain in right shoulder: Secondary | ICD-10-CM | POA: Diagnosis not present

## 2018-01-08 DIAGNOSIS — Z79891 Long term (current) use of opiate analgesic: Secondary | ICD-10-CM | POA: Diagnosis not present

## 2018-01-08 DIAGNOSIS — M79643 Pain in unspecified hand: Secondary | ICD-10-CM | POA: Diagnosis not present

## 2018-01-14 DIAGNOSIS — M7551 Bursitis of right shoulder: Secondary | ICD-10-CM | POA: Diagnosis not present

## 2018-01-14 DIAGNOSIS — Z4789 Encounter for other orthopedic aftercare: Secondary | ICD-10-CM | POA: Diagnosis not present

## 2018-01-14 DIAGNOSIS — Z9889 Other specified postprocedural states: Secondary | ICD-10-CM | POA: Diagnosis not present

## 2018-01-22 DIAGNOSIS — Z4789 Encounter for other orthopedic aftercare: Secondary | ICD-10-CM | POA: Diagnosis not present

## 2018-01-22 DIAGNOSIS — Z9889 Other specified postprocedural states: Secondary | ICD-10-CM | POA: Diagnosis not present

## 2018-01-22 DIAGNOSIS — M7551 Bursitis of right shoulder: Secondary | ICD-10-CM | POA: Diagnosis not present

## 2018-01-24 DIAGNOSIS — Z0000 Encounter for general adult medical examination without abnormal findings: Secondary | ICD-10-CM | POA: Diagnosis not present

## 2018-01-24 DIAGNOSIS — R6889 Other general symptoms and signs: Secondary | ICD-10-CM | POA: Diagnosis not present

## 2018-01-24 DIAGNOSIS — R002 Palpitations: Secondary | ICD-10-CM | POA: Diagnosis not present

## 2018-01-24 DIAGNOSIS — E782 Mixed hyperlipidemia: Secondary | ICD-10-CM | POA: Diagnosis not present

## 2018-01-24 DIAGNOSIS — Z9889 Other specified postprocedural states: Secondary | ICD-10-CM | POA: Diagnosis not present

## 2018-01-24 DIAGNOSIS — M940 Chondrocostal junction syndrome [Tietze]: Secondary | ICD-10-CM | POA: Diagnosis not present

## 2018-01-24 DIAGNOSIS — J301 Allergic rhinitis due to pollen: Secondary | ICD-10-CM | POA: Diagnosis not present

## 2018-01-24 DIAGNOSIS — I10 Essential (primary) hypertension: Secondary | ICD-10-CM | POA: Diagnosis not present

## 2018-01-24 DIAGNOSIS — F5101 Primary insomnia: Secondary | ICD-10-CM | POA: Diagnosis not present

## 2018-01-24 DIAGNOSIS — F332 Major depressive disorder, recurrent severe without psychotic features: Secondary | ICD-10-CM | POA: Diagnosis not present

## 2018-01-24 DIAGNOSIS — H259 Unspecified age-related cataract: Secondary | ICD-10-CM | POA: Diagnosis not present

## 2018-01-24 DIAGNOSIS — F411 Generalized anxiety disorder: Secondary | ICD-10-CM | POA: Diagnosis not present

## 2018-01-24 DIAGNOSIS — M1712 Unilateral primary osteoarthritis, left knee: Secondary | ICD-10-CM | POA: Diagnosis not present

## 2018-01-29 DIAGNOSIS — Z9889 Other specified postprocedural states: Secondary | ICD-10-CM | POA: Diagnosis not present

## 2018-01-29 DIAGNOSIS — M7551 Bursitis of right shoulder: Secondary | ICD-10-CM | POA: Diagnosis not present

## 2018-01-29 DIAGNOSIS — Z4789 Encounter for other orthopedic aftercare: Secondary | ICD-10-CM | POA: Diagnosis not present

## 2018-02-03 DIAGNOSIS — M7551 Bursitis of right shoulder: Secondary | ICD-10-CM | POA: Diagnosis not present

## 2018-02-03 DIAGNOSIS — Z9889 Other specified postprocedural states: Secondary | ICD-10-CM | POA: Diagnosis not present

## 2018-02-05 DIAGNOSIS — Z79891 Long term (current) use of opiate analgesic: Secondary | ICD-10-CM | POA: Diagnosis not present

## 2018-02-05 DIAGNOSIS — G894 Chronic pain syndrome: Secondary | ICD-10-CM | POA: Diagnosis not present

## 2018-02-05 DIAGNOSIS — M25511 Pain in right shoulder: Secondary | ICD-10-CM | POA: Diagnosis not present

## 2018-02-05 DIAGNOSIS — M542 Cervicalgia: Secondary | ICD-10-CM | POA: Diagnosis not present

## 2018-02-05 DIAGNOSIS — M79643 Pain in unspecified hand: Secondary | ICD-10-CM | POA: Diagnosis not present

## 2018-02-06 IMAGING — CR DG HIP (WITH OR WITHOUT PELVIS) 3-4V BILAT
5 series · 5 of 5 positions shown · non-contrast
Comparison: None.

CLINICAL DATA: 74-year-old who fell last night and again this
morning due to dizziness which she relates to a new medication.
Bilateral hip pain. Current history of hypertension. Patient struck
her forehead on the bathroom sink when she fell today.

EXAM:
DG HIP (WITH OR WITHOUT PELVIS) 3-4V BILAT

[x pelvis (1 of 2)]
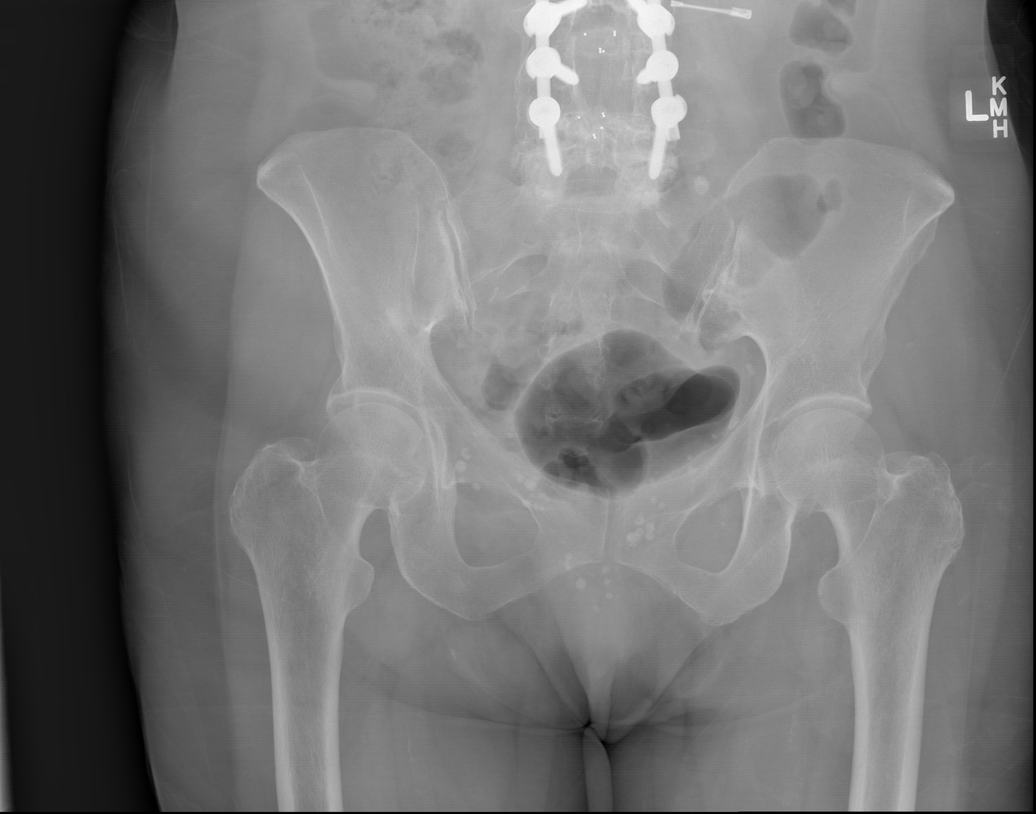

[x pelvis (2 of 2)]
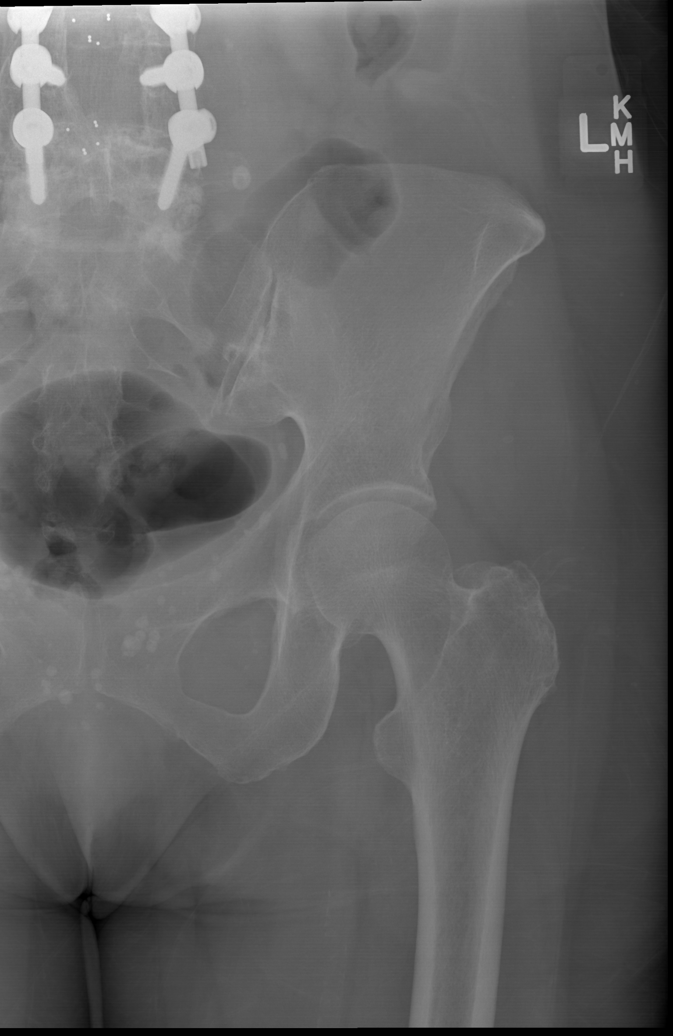

[x hip ap right (1 of 2)]
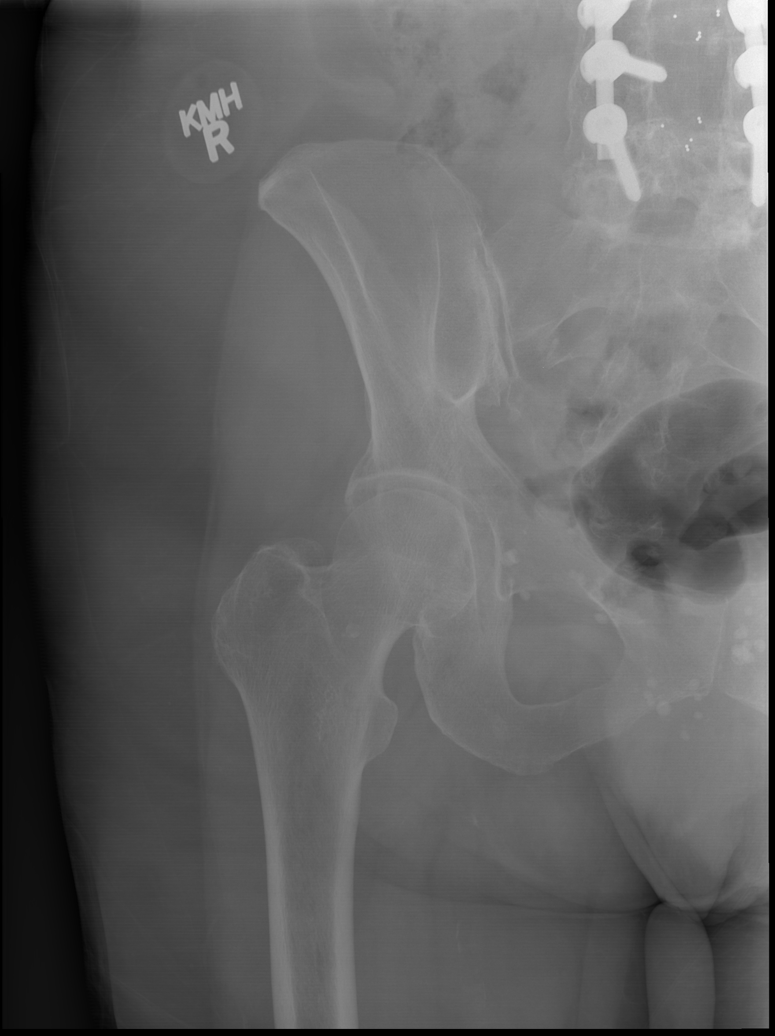

[x hip ap right (2 of 2)]
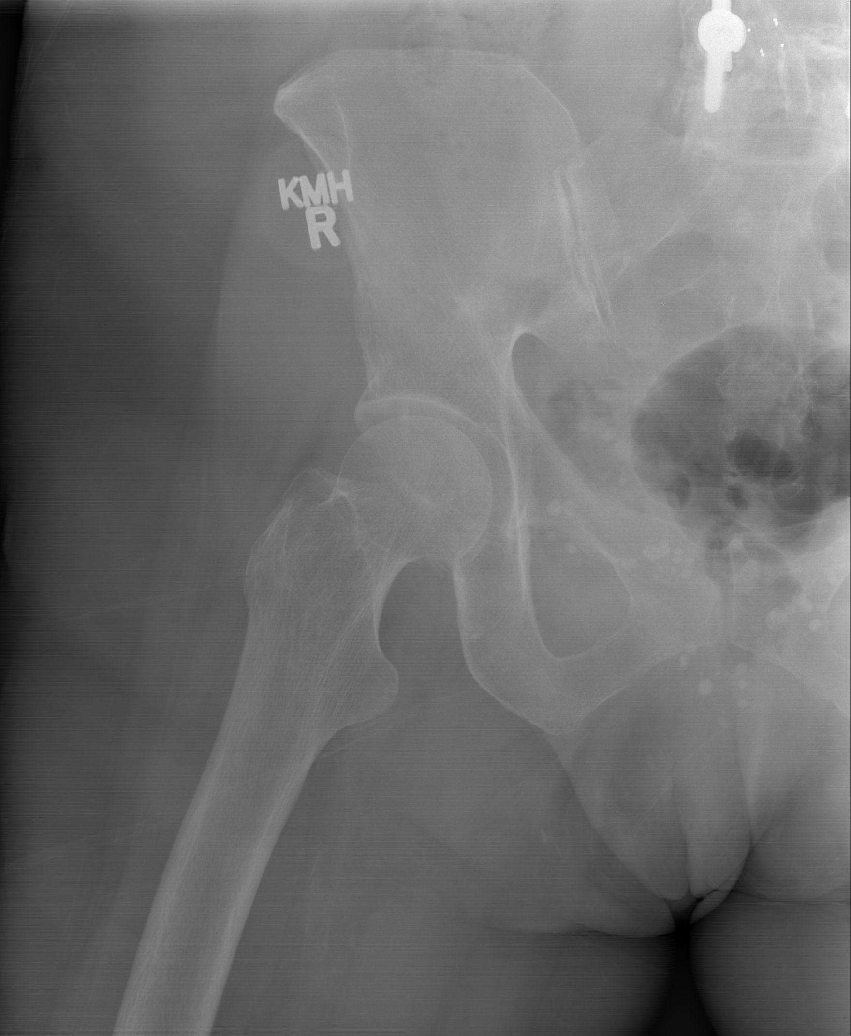

[x hip lat left]
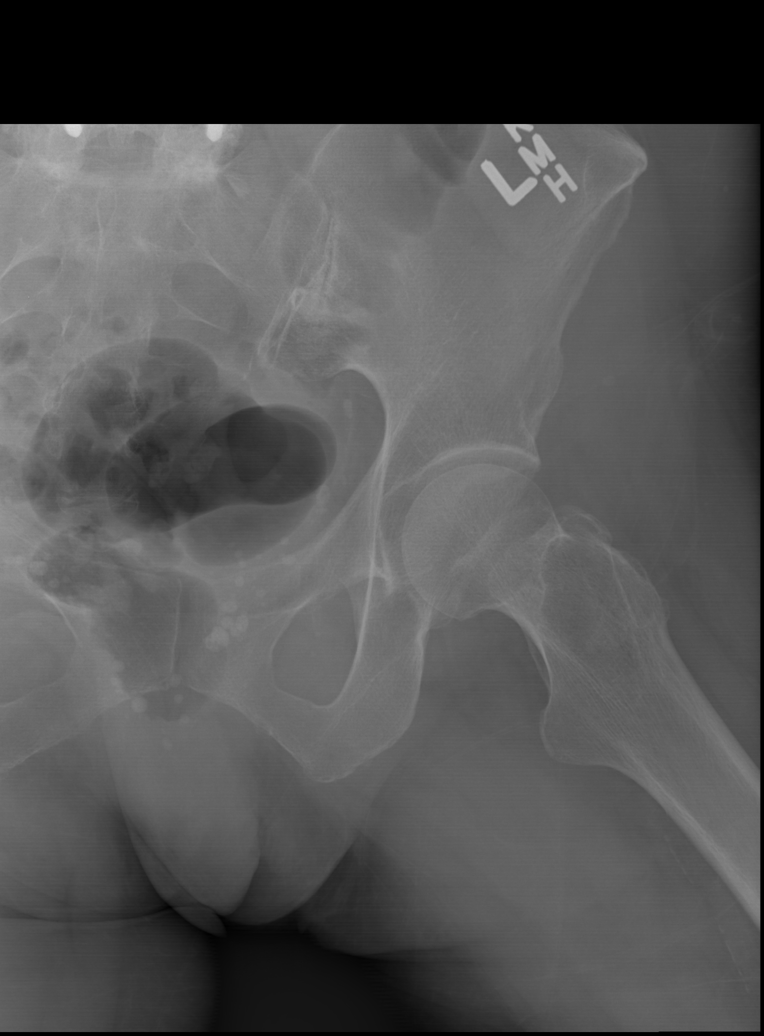

[5 of 5 positions shown; findings below may reference images not displayed]

FINDINGS: No acute fractures involving the bony pelvis or either proximal
femur. Both hip joints anatomically aligned with well-preserved
joint spaces. Sacroiliac joints and symphysis pubis intact. Bone
mineral density well-preserved for age. Prior L3 through L5 fusion
and posterior decompression.
IMPRESSION: No acute osseous abnormalities.

## 2018-02-12 DIAGNOSIS — Z9889 Other specified postprocedural states: Secondary | ICD-10-CM | POA: Diagnosis not present

## 2018-02-12 DIAGNOSIS — H2513 Age-related nuclear cataract, bilateral: Secondary | ICD-10-CM | POA: Diagnosis not present

## 2018-02-12 DIAGNOSIS — M7551 Bursitis of right shoulder: Secondary | ICD-10-CM | POA: Diagnosis not present

## 2018-02-19 DIAGNOSIS — M7551 Bursitis of right shoulder: Secondary | ICD-10-CM | POA: Diagnosis not present

## 2018-02-19 DIAGNOSIS — Z9889 Other specified postprocedural states: Secondary | ICD-10-CM | POA: Diagnosis not present

## 2018-02-26 DIAGNOSIS — M7551 Bursitis of right shoulder: Secondary | ICD-10-CM | POA: Diagnosis not present

## 2018-02-26 DIAGNOSIS — Z9889 Other specified postprocedural states: Secondary | ICD-10-CM | POA: Diagnosis not present

## 2018-03-05 DIAGNOSIS — M542 Cervicalgia: Secondary | ICD-10-CM | POA: Diagnosis not present

## 2018-03-05 DIAGNOSIS — M79643 Pain in unspecified hand: Secondary | ICD-10-CM | POA: Diagnosis not present

## 2018-03-05 DIAGNOSIS — M25511 Pain in right shoulder: Secondary | ICD-10-CM | POA: Diagnosis not present

## 2018-03-05 DIAGNOSIS — Z79891 Long term (current) use of opiate analgesic: Secondary | ICD-10-CM | POA: Diagnosis not present

## 2018-03-05 DIAGNOSIS — G894 Chronic pain syndrome: Secondary | ICD-10-CM | POA: Diagnosis not present

## 2018-03-27 DIAGNOSIS — F321 Major depressive disorder, single episode, moderate: Secondary | ICD-10-CM | POA: Diagnosis not present

## 2018-03-27 DIAGNOSIS — R411 Anterograde amnesia: Secondary | ICD-10-CM | POA: Diagnosis not present

## 2018-03-27 DIAGNOSIS — F0390 Unspecified dementia without behavioral disturbance: Secondary | ICD-10-CM | POA: Diagnosis not present

## 2018-03-27 DIAGNOSIS — F0150 Vascular dementia without behavioral disturbance: Secondary | ICD-10-CM | POA: Diagnosis not present

## 2018-03-28 DIAGNOSIS — R1030 Lower abdominal pain, unspecified: Secondary | ICD-10-CM | POA: Diagnosis not present

## 2018-04-02 DIAGNOSIS — M79643 Pain in unspecified hand: Secondary | ICD-10-CM | POA: Diagnosis not present

## 2018-04-02 DIAGNOSIS — M25511 Pain in right shoulder: Secondary | ICD-10-CM | POA: Diagnosis not present

## 2018-04-02 DIAGNOSIS — G894 Chronic pain syndrome: Secondary | ICD-10-CM | POA: Diagnosis not present

## 2018-04-02 DIAGNOSIS — Z79891 Long term (current) use of opiate analgesic: Secondary | ICD-10-CM | POA: Diagnosis not present

## 2018-04-02 DIAGNOSIS — M542 Cervicalgia: Secondary | ICD-10-CM | POA: Diagnosis not present

## 2018-04-25 DIAGNOSIS — G4700 Insomnia, unspecified: Secondary | ICD-10-CM | POA: Diagnosis not present

## 2018-04-28 DIAGNOSIS — M542 Cervicalgia: Secondary | ICD-10-CM | POA: Diagnosis not present

## 2018-04-28 DIAGNOSIS — Z79891 Long term (current) use of opiate analgesic: Secondary | ICD-10-CM | POA: Diagnosis not present

## 2018-04-28 DIAGNOSIS — M25511 Pain in right shoulder: Secondary | ICD-10-CM | POA: Diagnosis not present

## 2018-04-28 DIAGNOSIS — M79643 Pain in unspecified hand: Secondary | ICD-10-CM | POA: Diagnosis not present

## 2018-04-28 DIAGNOSIS — G894 Chronic pain syndrome: Secondary | ICD-10-CM | POA: Diagnosis not present

## 2018-05-13 DIAGNOSIS — H25043 Posterior subcapsular polar age-related cataract, bilateral: Secondary | ICD-10-CM | POA: Diagnosis not present

## 2018-05-13 DIAGNOSIS — H2511 Age-related nuclear cataract, right eye: Secondary | ICD-10-CM | POA: Diagnosis not present

## 2018-05-13 DIAGNOSIS — H02831 Dermatochalasis of right upper eyelid: Secondary | ICD-10-CM | POA: Diagnosis not present

## 2018-05-13 DIAGNOSIS — H25013 Cortical age-related cataract, bilateral: Secondary | ICD-10-CM | POA: Diagnosis not present

## 2018-05-13 DIAGNOSIS — H18413 Arcus senilis, bilateral: Secondary | ICD-10-CM | POA: Diagnosis not present

## 2018-05-13 DIAGNOSIS — H2513 Age-related nuclear cataract, bilateral: Secondary | ICD-10-CM | POA: Diagnosis not present

## 2018-05-19 ENCOUNTER — Other Ambulatory Visit: Payer: Self-pay

## 2018-05-22 DIAGNOSIS — F5101 Primary insomnia: Secondary | ICD-10-CM | POA: Diagnosis not present

## 2018-05-22 DIAGNOSIS — M62838 Other muscle spasm: Secondary | ICD-10-CM | POA: Diagnosis not present

## 2018-05-22 DIAGNOSIS — M79601 Pain in right arm: Secondary | ICD-10-CM | POA: Diagnosis not present

## 2018-05-22 DIAGNOSIS — R110 Nausea: Secondary | ICD-10-CM | POA: Diagnosis not present

## 2018-05-22 DIAGNOSIS — K529 Noninfective gastroenteritis and colitis, unspecified: Secondary | ICD-10-CM | POA: Diagnosis not present

## 2018-05-28 DIAGNOSIS — Z79891 Long term (current) use of opiate analgesic: Secondary | ICD-10-CM | POA: Diagnosis not present

## 2018-05-28 DIAGNOSIS — M542 Cervicalgia: Secondary | ICD-10-CM | POA: Diagnosis not present

## 2018-05-28 DIAGNOSIS — M25511 Pain in right shoulder: Secondary | ICD-10-CM | POA: Diagnosis not present

## 2018-05-28 DIAGNOSIS — G894 Chronic pain syndrome: Secondary | ICD-10-CM | POA: Diagnosis not present

## 2018-05-28 DIAGNOSIS — M79643 Pain in unspecified hand: Secondary | ICD-10-CM | POA: Diagnosis not present

## 2018-05-30 DIAGNOSIS — R109 Unspecified abdominal pain: Secondary | ICD-10-CM | POA: Diagnosis not present

## 2018-05-30 DIAGNOSIS — R51 Headache: Secondary | ICD-10-CM | POA: Diagnosis not present

## 2018-05-30 DIAGNOSIS — Z885 Allergy status to narcotic agent status: Secondary | ICD-10-CM | POA: Diagnosis not present

## 2018-05-30 DIAGNOSIS — K5730 Diverticulosis of large intestine without perforation or abscess without bleeding: Secondary | ICD-10-CM | POA: Diagnosis not present

## 2018-05-30 DIAGNOSIS — R1013 Epigastric pain: Secondary | ICD-10-CM | POA: Diagnosis not present

## 2018-05-30 DIAGNOSIS — G894 Chronic pain syndrome: Secondary | ICD-10-CM | POA: Diagnosis not present

## 2018-05-30 DIAGNOSIS — M797 Fibromyalgia: Secondary | ICD-10-CM | POA: Diagnosis not present

## 2018-05-30 DIAGNOSIS — I10 Essential (primary) hypertension: Secondary | ICD-10-CM | POA: Diagnosis not present

## 2018-05-30 DIAGNOSIS — G4489 Other headache syndrome: Secondary | ICD-10-CM | POA: Diagnosis not present

## 2018-05-30 DIAGNOSIS — N179 Acute kidney failure, unspecified: Secondary | ICD-10-CM | POA: Diagnosis not present

## 2018-05-30 DIAGNOSIS — N281 Cyst of kidney, acquired: Secondary | ICD-10-CM | POA: Diagnosis not present

## 2018-05-30 DIAGNOSIS — R112 Nausea with vomiting, unspecified: Secondary | ICD-10-CM | POA: Diagnosis not present

## 2018-05-30 DIAGNOSIS — K209 Esophagitis, unspecified: Secondary | ICD-10-CM | POA: Diagnosis not present

## 2018-05-30 DIAGNOSIS — R1111 Vomiting without nausea: Secondary | ICD-10-CM | POA: Diagnosis not present

## 2018-05-30 DIAGNOSIS — K7689 Other specified diseases of liver: Secondary | ICD-10-CM | POA: Diagnosis not present

## 2018-05-30 DIAGNOSIS — R52 Pain, unspecified: Secondary | ICD-10-CM | POA: Diagnosis not present

## 2018-05-30 DIAGNOSIS — R1110 Vomiting, unspecified: Secondary | ICD-10-CM | POA: Diagnosis not present

## 2018-05-30 DIAGNOSIS — Z880 Allergy status to penicillin: Secondary | ICD-10-CM | POA: Diagnosis not present

## 2018-05-31 DIAGNOSIS — F0390 Unspecified dementia without behavioral disturbance: Secondary | ICD-10-CM | POA: Diagnosis present

## 2018-05-31 DIAGNOSIS — Z888 Allergy status to other drugs, medicaments and biological substances status: Secondary | ICD-10-CM | POA: Diagnosis not present

## 2018-05-31 DIAGNOSIS — J301 Allergic rhinitis due to pollen: Secondary | ICD-10-CM | POA: Diagnosis present

## 2018-05-31 DIAGNOSIS — K5909 Other constipation: Secondary | ICD-10-CM | POA: Diagnosis present

## 2018-05-31 DIAGNOSIS — K222 Esophageal obstruction: Secondary | ICD-10-CM | POA: Diagnosis not present

## 2018-05-31 DIAGNOSIS — F419 Anxiety disorder, unspecified: Secondary | ICD-10-CM | POA: Diagnosis present

## 2018-05-31 DIAGNOSIS — G4730 Sleep apnea, unspecified: Secondary | ICD-10-CM | POA: Diagnosis present

## 2018-05-31 DIAGNOSIS — K2970 Gastritis, unspecified, without bleeding: Secondary | ICD-10-CM | POA: Diagnosis not present

## 2018-05-31 DIAGNOSIS — Z7951 Long term (current) use of inhaled steroids: Secondary | ICD-10-CM | POA: Diagnosis not present

## 2018-05-31 DIAGNOSIS — K2960 Other gastritis without bleeding: Secondary | ICD-10-CM | POA: Diagnosis not present

## 2018-05-31 DIAGNOSIS — R112 Nausea with vomiting, unspecified: Secondary | ICD-10-CM | POA: Diagnosis not present

## 2018-05-31 DIAGNOSIS — R002 Palpitations: Secondary | ICD-10-CM | POA: Diagnosis present

## 2018-05-31 DIAGNOSIS — M797 Fibromyalgia: Secondary | ICD-10-CM | POA: Diagnosis present

## 2018-05-31 DIAGNOSIS — R1310 Dysphagia, unspecified: Secondary | ICD-10-CM | POA: Diagnosis present

## 2018-05-31 DIAGNOSIS — R1110 Vomiting, unspecified: Secondary | ICD-10-CM | POA: Diagnosis not present

## 2018-05-31 DIAGNOSIS — M1990 Unspecified osteoarthritis, unspecified site: Secondary | ICD-10-CM | POA: Diagnosis present

## 2018-05-31 DIAGNOSIS — N182 Chronic kidney disease, stage 2 (mild): Secondary | ICD-10-CM | POA: Diagnosis present

## 2018-05-31 DIAGNOSIS — N179 Acute kidney failure, unspecified: Secondary | ICD-10-CM | POA: Diagnosis present

## 2018-05-31 DIAGNOSIS — F5101 Primary insomnia: Secondary | ICD-10-CM | POA: Diagnosis present

## 2018-05-31 DIAGNOSIS — K219 Gastro-esophageal reflux disease without esophagitis: Secondary | ICD-10-CM | POA: Diagnosis present

## 2018-05-31 DIAGNOSIS — Z880 Allergy status to penicillin: Secondary | ICD-10-CM | POA: Diagnosis not present

## 2018-05-31 DIAGNOSIS — I10 Essential (primary) hypertension: Secondary | ICD-10-CM | POA: Diagnosis not present

## 2018-05-31 DIAGNOSIS — E782 Mixed hyperlipidemia: Secondary | ICD-10-CM | POA: Diagnosis present

## 2018-05-31 DIAGNOSIS — Z7982 Long term (current) use of aspirin: Secondary | ICD-10-CM | POA: Diagnosis not present

## 2018-05-31 DIAGNOSIS — I129 Hypertensive chronic kidney disease with stage 1 through stage 4 chronic kidney disease, or unspecified chronic kidney disease: Secondary | ICD-10-CM | POA: Diagnosis present

## 2018-05-31 DIAGNOSIS — Z79899 Other long term (current) drug therapy: Secondary | ICD-10-CM | POA: Diagnosis not present

## 2018-05-31 DIAGNOSIS — Z885 Allergy status to narcotic agent status: Secondary | ICD-10-CM | POA: Diagnosis not present

## 2018-05-31 DIAGNOSIS — G894 Chronic pain syndrome: Secondary | ICD-10-CM | POA: Diagnosis present

## 2018-05-31 DIAGNOSIS — K209 Esophagitis, unspecified: Secondary | ICD-10-CM | POA: Diagnosis present

## 2018-06-09 DIAGNOSIS — H2513 Age-related nuclear cataract, bilateral: Secondary | ICD-10-CM | POA: Diagnosis not present

## 2018-06-09 DIAGNOSIS — H2511 Age-related nuclear cataract, right eye: Secondary | ICD-10-CM | POA: Diagnosis not present

## 2018-06-10 DIAGNOSIS — H2512 Age-related nuclear cataract, left eye: Secondary | ICD-10-CM | POA: Diagnosis not present

## 2018-06-11 ENCOUNTER — Telehealth: Payer: Self-pay | Admitting: Internal Medicine

## 2018-06-11 NOTE — Telephone Encounter (Signed)
[facility], but hasn't been seen here since Jan. 2018.  I spoke with the patient and her caregiver, who confirmed that her PCP [facility] them.  I encouraged her to let Medicare know that her PCP has changed. VDM (DD)

## 2018-06-16 DIAGNOSIS — G2581 Restless legs syndrome: Secondary | ICD-10-CM | POA: Diagnosis not present

## 2018-06-16 DIAGNOSIS — Z23 Encounter for immunization: Secondary | ICD-10-CM | POA: Diagnosis not present

## 2018-06-16 DIAGNOSIS — M62838 Other muscle spasm: Secondary | ICD-10-CM | POA: Diagnosis not present

## 2018-06-16 DIAGNOSIS — F5101 Primary insomnia: Secondary | ICD-10-CM | POA: Diagnosis not present

## 2018-06-16 DIAGNOSIS — R110 Nausea: Secondary | ICD-10-CM | POA: Diagnosis not present

## 2018-06-18 DIAGNOSIS — R1111 Vomiting without nausea: Secondary | ICD-10-CM | POA: Diagnosis not present

## 2018-06-18 DIAGNOSIS — R112 Nausea with vomiting, unspecified: Secondary | ICD-10-CM | POA: Diagnosis not present

## 2018-06-20 DIAGNOSIS — H2512 Age-related nuclear cataract, left eye: Secondary | ICD-10-CM | POA: Diagnosis not present

## 2018-06-20 DIAGNOSIS — H2513 Age-related nuclear cataract, bilateral: Secondary | ICD-10-CM | POA: Diagnosis not present

## 2018-06-30 DIAGNOSIS — Z79891 Long term (current) use of opiate analgesic: Secondary | ICD-10-CM | POA: Diagnosis not present

## 2018-06-30 DIAGNOSIS — M542 Cervicalgia: Secondary | ICD-10-CM | POA: Diagnosis not present

## 2018-06-30 DIAGNOSIS — M79643 Pain in unspecified hand: Secondary | ICD-10-CM | POA: Diagnosis not present

## 2018-06-30 DIAGNOSIS — G894 Chronic pain syndrome: Secondary | ICD-10-CM | POA: Diagnosis not present

## 2018-06-30 DIAGNOSIS — M25511 Pain in right shoulder: Secondary | ICD-10-CM | POA: Diagnosis not present

## 2019-01-19 ENCOUNTER — Ambulatory Visit: Payer: Self-pay | Admitting: Cardiology

## 2019-01-19 ENCOUNTER — Other Ambulatory Visit: Payer: Self-pay

## 2019-01-19 MED ORDER — METOPROLOL SUCCINATE ER 25 MG PO TB24: 25.0000 mg | ORAL_TABLET | Freq: Every day | ORAL | 0 refills | Status: AC

## 2019-01-29 ENCOUNTER — Ambulatory Visit: Payer: Self-pay | Admitting: Cardiology

## 2019-02-03 ENCOUNTER — Ambulatory Visit: Payer: Self-pay | Admitting: Cardiology

## 2019-02-09 ENCOUNTER — Ambulatory Visit (INDEPENDENT_AMBULATORY_CARE_PROVIDER_SITE_OTHER): Payer: Medicare Other | Admitting: Cardiology

## 2019-02-09 ENCOUNTER — Encounter: Payer: Self-pay | Admitting: Cardiology

## 2019-02-09 ENCOUNTER — Other Ambulatory Visit: Payer: Self-pay

## 2019-02-09 VITALS — BP 147/78 | HR 73 | Temp 98.0°F | Ht 60.0 in | Wt 134.0 lb

## 2019-02-09 DIAGNOSIS — I10 Essential (primary) hypertension: Secondary | ICD-10-CM

## 2019-02-09 DIAGNOSIS — R0602 Shortness of breath: Secondary | ICD-10-CM | POA: Diagnosis not present

## 2019-02-09 DIAGNOSIS — E7800 Pure hypercholesterolemia, unspecified: Secondary | ICD-10-CM | POA: Diagnosis not present

## 2019-02-09 DIAGNOSIS — R079 Chest pain, unspecified: Secondary | ICD-10-CM

## 2019-02-09 DIAGNOSIS — R0789 Other chest pain: Secondary | ICD-10-CM | POA: Diagnosis not present

## 2019-02-09 NOTE — Progress Notes (Signed)
Subjective:  Primary Physician:  Jettie Booze, NP  Patient ID: Brooke [name], female    DOB: [birth year], 78 y.o.   MRN: [id number]  Chief Complaint  Patient presents with  . Chest Pain    f/u,  . Shortness of Breath  . Follow-up    HPI: Brooke [name]  is a 78 y.o. female    Past Medical History:  Diagnosis Date  . Anxiety   . Hypertension     Past Surgical History:  Procedure Laterality Date  . BACK SURGERY    . BUNIONECTOMY Left 07/21/2009   Left foot  . CAPSULOTOMY Right 07/21/2009   Rt #2 MPJ  . Hammer Toe Repair Right 07/21/2009   Rt #2  . JOINT REPLACEMENT    . OSTEOTOMY Right 02/03/2015   Rt 5th met  . Tarsal Exostectomy Right 02/03/2015  . TENOTOMY / FLEXOR TENDON TRANSFER Right 07/21/2009   Right foot    Social History   Socioeconomic History  . Marital status: Divorced    Spouse name: Not on file  . Number of children: Not on file  . Years of education: Not on file  . Highest education level: Not on file  Occupational History  . Not on file  Social Needs  . Financial resource strain: Not on file  . Food insecurity    Worry: Not on file    Inability: Not on file  . Transportation needs    Medical: Not on file    Non-medical: Not on file  Tobacco Use  . Smoking status: Never Smoker  . Smokeless tobacco: Never Used  Substance and Sexual Activity  . Alcohol use: No    Alcohol/week: 0.0 standard drinks  . Drug use: No  . Sexual activity: Never  Lifestyle  . Physical activity    Days per week: Not on file    Minutes per session: Not on file  . Stress: Not on file  Relationships  . Social Herbalist on phone: Not on file    Gets together: Not on file    Attends religious service: Not on file    Active member of club or organization: Not on file    Attends meetings of clubs or organizations: Not on file    Relationship status: Not on file  . Intimate partner violence    Fear of current or ex partner: Not on file     Emotionally abused: Not on file    Physically abused: Not on file    Forced sexual activity: Not on file  Other Topics Concern  . Not on file  Social History Narrative  . Not on file    Current Outpatient Medications on File Prior to Visit  Medication Sig Dispense Refill  . citalopram (CELEXA) 40 MG tablet Take 40 mg by mouth daily.    Marland Kitchen gabapentin (NEURONTIN) 100 MG capsule Take 100 mg by mouth 3 (three) times daily.     . metoprolol succinate (TOPROL-XL) 25 MG 24 hr tablet Take 1 tablet (25 mg total) by mouth daily. 90 tablet 0  . mirtazapine (REMERON) 7.5 MG tablet Take 15 mg by mouth.     . oxyCODONE (ROXICODONE) 15 MG immediate release tablet Take 15 mg by mouth every 6 (six) hours as needed.    . pantoprazole (PROTONIX) 40 MG tablet Take 40 mg by mouth daily.    . temazepam (RESTORIL) 30 MG capsule Take 30 mg by mouth at bedtime as  needed. for sleep    . tiZANidine (ZANAFLEX) 4 MG tablet Take 2 mg by mouth 3 times/day as needed-between meals & bedtime.    . vitamin B-12 (CYANOCOBALAMIN) 500 MCG tablet Take 500 mcg by mouth daily.     No current facility-administered medications on file prior to visit.     ROS     Objective:  Blood pressure (!) 147/78, pulse 73, temperature 98 F (36.7 C), height 5' (1.524 m), weight 134 lb (60.8 kg), SpO2 96 %. Body mass index is 26.17 kg/m.  Physical Exam  CARDIAC STUDIES:   Assessment & Recommendations:   1.Chest pain of uncertain etiology - Plan: EKG 12-Lead-  Sinus  Rhythm, Nonspecific ST-T wave abnormality.   PCV MYOCARDIAL PERFUSION WITH LEXISCAN  2. Shortness of breath - Plan: PCV ECHOCARDIOGRAM COMPLETE  3. Essential hypertension  4. Hypercholesterolemia  Laboratory Exam:  CBC Latest Ref Rng & Units 11/21/2015 11/20/2015 11/19/2015  WBC 4.0 - 10.5 K/uL 4.3 4.8 8.1  Hemoglobin 12.0 - 15.0 g/dL 11.2(L) 11.1(L) 10.8(L)  Hematocrit 36.0 - 46.0 % 34.1(L) 33.7(L) 32.5(L)  Platelets 150 - 400 K/uL 240 234 234   CMP Latest  Ref Rng & Units 11/21/2015 11/20/2015 11/19/2015  Glucose 65 - 99 mg/dL 85 - 107(H)  BUN 6 - 20 mg/dL 11 - 20  Creatinine 0.44 - 1.00 mg/dL 0.92 0.73 0.88  Sodium 135 - 145 mmol/L 140 - 139  Potassium 3.5 - 5.1 mmol/L 3.7 - 3.8  Chloride 101 - 111 mmol/L 110 - 106  CO2 22 - 32 mmol/L 24 - 27  Calcium 8.9 - 10.3 mg/dL 8.4(L) - 8.7(L)  Total Protein 6.5 - 8.1 g/dL - 6.4(L) 6.7  Total Bilirubin 0.3 - 1.2 mg/dL - 0.4 0.6  Alkaline Phos 38 - 126 U/L - 57 69  AST 15 - 41 U/L - 21 21  ALT 14 - 54 U/L - 13(L) 12(L)   Lipid Panel  No results found for: CHOL, TRIG, HDL, CHOLHDL, VLDL, LDLCALC, LDLDIRECT   Recommendation:   Despina Hick, MD, [facility] 02/09/2019, 10:11 PM [facility]. [place] Pager: [phone] Office: [phone] If no answer Cell [phone]

## 2019-02-09 NOTE — Progress Notes (Signed)
Subjective:  Primary Physician:  Jettie Booze, NP  Patient ID: Brooke [name], female    DOB: May 23, 1941, 78 y.o.   MRN: [id number]  Chief Complaint  Patient presents with  . Chest Pain  . Shortness of Breath    HPI: Brooke [name]  is a 78 y.o. female. Patient was seen in our office 2 years ago.  She came today for complaints of chest pain and shortness of breath.  Symptoms occurr when she walks upstairs.  Patient feels heaviness in the substernal region. There is no radiation to the arms, neck or back.  She also feels shortness of breath on going upstairs.  No diaphoresis or nausea any time. No history of orthopnea or PND. No swelling on the legs and no complaints of claudication.  Patient had history of palpitation in the past but denies any symptoms presently.  She has occasional dizziness but no history of near syncope or syncope.   No history of diabetes.  Patient has hypertension and hypercholesterolemia. She does not smoke. No history of thyroid problems. No history of TIA or CVA.  Past Medical History:  Diagnosis Date  . Anxiety   . Hypertension     Past Surgical History:  Procedure Laterality Date  . BACK SURGERY    . BUNIONECTOMY Left 07/21/2009   Left foot  . CAPSULOTOMY Right 07/21/2009   Rt #2 MPJ  . Hammer Toe Repair Right 07/21/2009   Rt #2  . JOINT REPLACEMENT    . OSTEOTOMY Right 02/03/2015   Rt 5th met  . Tarsal Exostectomy Right 02/03/2015  . TENOTOMY / FLEXOR TENDON TRANSFER Right 07/21/2009   Right foot    Social History   Socioeconomic History  . Marital status: Divorced    Spouse name: Not on file  . Number of children: Not on file  . Years of education: Not on file  . Highest education level: Not on file  Occupational History  . Not on file  Social Needs  . Financial resource strain: Not on file  . Food insecurity    Worry: Not on file    Inability: Not on file  . Transportation needs    Medical: Not on file    Non-medical: Not on  file  Tobacco Use  . Smoking status: Never Smoker  . Smokeless tobacco: Never Used  Substance and Sexual Activity  . Alcohol use: No    Alcohol/week: 0.0 standard drinks  . Drug use: No  . Sexual activity: Never  Lifestyle  . Physical activity    Days per week: Not on file    Minutes per session: Not on file  . Stress: Not on file  Relationships  . Social Herbalist on phone: Not on file    Gets together: Not on file    Attends religious service: Not on file    Active member of club or organization: Not on file    Attends meetings of clubs or organizations: Not on file    Relationship status: Not on file  . Intimate partner violence    Fear of current or ex partner: Not on file    Emotionally abused: Not on file    Physically abused: Not on file    Forced sexual activity: Not on file  Other Topics Concern  . Not on file  Social History Narrative  . Not on file    Current Outpatient Medications on File Prior to Visit  Medication Sig  Dispense Refill  . citalopram (CELEXA) 40 MG tablet Take 40 mg by mouth daily.    . gabapentin (NEURONTIN) 100 MG capsule Take 100 mg by mouth 3 (three) times daily.     . metoprolol succinate (TOPROL-XL) 25 MG 24 hr tablet Take 1 tablet (25 mg total) by mouth daily. 90 tablet 0  . mirtazapine (REMERON) 7.5 MG tablet Take 15 mg by mouth.     . oxyCODONE (ROXICODONE) 15 MG immediate release tablet Take 15 mg by mouth every 6 (six) hours as needed.    . pantoprazole (PROTONIX) 40 MG tablet Take 40 mg by mouth daily.    . temazepam (RESTORIL) 30 MG capsule Take 30 mg by mouth at bedtime as needed. for sleep    . tiZANidine (ZANAFLEX) 4 MG tablet Take 2 mg by mouth 3 times/day as needed-between meals & bedtime.    . vitamin B-12 (CYANOCOBALAMIN) 500 MCG tablet Take 500 mcg by mouth daily.     No current facility-administered medications on file prior to visit.     Review of Systems  Constitutional: Negative for fever.  HENT: Negative  for nosebleeds.   Eyes: Negative for blurred vision.  Respiratory: Positive for shortness of breath (on exertion). Negative for cough.   Cardiovascular: Positive for chest pain (on exertion). Negative for palpitations, orthopnea, claudication and leg swelling.  Gastrointestinal: Negative for abdominal pain, nausea and vomiting.  Genitourinary: Negative for dysuria.  Musculoskeletal: Negative for myalgias.  Skin: Negative for itching and rash.  Neurological: Negative for dizziness, seizures and loss of consciousness.  Psychiatric/Behavioral: The patient is not nervous/anxious.       Objective:  Blood pressure (!) 147/78, pulse 73, temperature 98 F (36.7 C), height 5' (1.524 m), weight 134 lb (60.8 kg), SpO2 96 %. Body mass index is 26.17 kg/m.  Physical Exam  Constitutional: She is oriented to person, place, and time. She appears well-developed and well-nourished.  HENT:  Head: Normocephalic and atraumatic.  Eyes: Conjunctivae are normal.  Neck: No thyromegaly present.  Cardiovascular: Normal rate, regular rhythm and normal heart sounds. Exam reveals no gallop.  No murmur heard. Pulses:      Femoral pulses are 2+ on the right side and 2+ on the left side.      Dorsalis pedis pulses are 2+ on the right side and 2+ on the left side.       Posterior tibial pulses are 2+ on the right side and 2+ on the left side.  Pulmonary/Chest: She has no wheezes. She has no rales.  Abdominal: She exhibits no mass. There is no abdominal tenderness.  Musculoskeletal:        General: No edema.  Lymphadenopathy:    She has no cervical adenopathy.  Neurological: She is alert and oriented to person, place, and time.  Skin: Skin is warm.    CARDIAC STUDIES:   Assessment & Recommendations:  Chest pain of uncertain etiology - Plan: EKG - Sinus  Rhythm,  Nonspecific ST-T wave abnormality PCV MYOCARDIAL PERFUSION WITH LEXISCAN  Shortness of breath - Plan: PCV ECHOCARDIOGRAM COMPLETE  Essential  hypertension   Hypercholesterolemia   Laboratory Exam:  CBC Latest Ref Rng & Units 11/21/2015 11/20/2015 11/19/2015  WBC 4.0 - 10.5 K/uL 4.3 4.8 8.1  Hemoglobin 12.0 - 15.0 g/dL 11.2(L) 11.1(L) 10.8(L)  Hematocrit 36.0 - 46.0 % 34.1(L) 33.7(L) 32.5(L)  Platelets 150 - 400 K/uL 240 234 234   CMP Latest Ref Rng & Units 11/21/2015 11/20/2015 11/19/2015  Glucose 65 -   99 mg/dL 85 - 107(H)  BUN 6 - 20 mg/dL 11 - 20  Creatinine 0.44 - 1.00 mg/dL 0.92 0.73 0.88  Sodium 135 - 145 mmol/L 140 - 139  Potassium 3.5 - 5.1 mmol/L 3.7 - 3.8  Chloride 101 - 111 mmol/L 110 - 106  CO2 22 - 32 mmol/L 24 - 27  Calcium 8.9 - 10.3 mg/dL 8.4(L) - 8.7(L)  Total Protein 6.5 - 8.1 g/dL - 6.4(L) 6.7  Total Bilirubin 0.3 - 1.2 mg/dL - 0.4 0.6  Alkaline Phos 38 - 126 U/L - 57 69  AST 15 - 41 U/L - 21 21  ALT 14 - 54 U/L - 13(L) 12(L)   Lipid Panel  No results found for: CHOL, TRIG, HDL, CHOLHDL, VLDL, LDLCALC, LDLDIRECT   Recommendation:  I have discussed my assessment with the patient.  She has complaints of exertional chest pain and has shortness of breath.  She has been scheduled for Lexiscan Myoview scans for assessment for ischemia and echocardiogram to assess left ventricular function. She will continue Metoprolol.  Primary prevention was discussed with her.  She was advised to follow low-salt, low-cholesterol diet. Total time spent with patient was 30 minutes and greater than 50% of that time was spent face to face in counseling and coordination care with the patient and  family regarding her condition, management, dietary counseling, risk factors control etc.  She will return for follow-up after 2 months.   Despina Hick, MD, [facility] 02/11/2019, 11:36 AM [facility]. [place] Pager: [phone] Office: [phone] If no answer Cell [phone]

## 2019-02-10 ENCOUNTER — Other Ambulatory Visit: Payer: Self-pay | Admitting: Nurse Practitioner

## 2019-02-23 ENCOUNTER — Ambulatory Visit (INDEPENDENT_AMBULATORY_CARE_PROVIDER_SITE_OTHER): Payer: Medicare Other

## 2019-02-23 ENCOUNTER — Other Ambulatory Visit: Payer: Self-pay

## 2019-02-23 DIAGNOSIS — R079 Chest pain, unspecified: Secondary | ICD-10-CM

## 2019-02-23 DIAGNOSIS — R0789 Other chest pain: Secondary | ICD-10-CM | POA: Diagnosis not present

## 2019-03-04 ENCOUNTER — Other Ambulatory Visit: Payer: Medicare Other

## 2019-03-10 ENCOUNTER — Other Ambulatory Visit: Payer: Medicare Other

## 2019-03-20 ENCOUNTER — Ambulatory Visit (INDEPENDENT_AMBULATORY_CARE_PROVIDER_SITE_OTHER): Payer: Medicare Other

## 2019-03-20 ENCOUNTER — Other Ambulatory Visit: Payer: Self-pay

## 2019-03-20 DIAGNOSIS — R0602 Shortness of breath: Secondary | ICD-10-CM | POA: Diagnosis not present

## 2019-04-06 ENCOUNTER — Telehealth: Payer: Self-pay

## 2019-04-06 NOTE — Telephone Encounter (Signed)
Stress test was low risk, no evidence of perfusion abnormalities. Echocardiogram essentially normal. Not sure why she doesnt have follow up scheduled. Let front staff know that she needs follow up scheduled to discuss results

## 2019-04-06 NOTE — Telephone Encounter (Signed)
Pt was never informed of her echo/lexi scan results/ not sure who she will f/u with, can you please help?

## 2019-04-27 ENCOUNTER — Ambulatory Visit: Payer: Medicare Other | Admitting: Cardiology

## 2019-04-27 ENCOUNTER — Telehealth: Payer: Self-pay

## 2019-04-27 ENCOUNTER — Telehealth: Payer: Self-pay | Admitting: Cardiology

## 2019-04-27 NOTE — Telephone Encounter (Signed)
Pt aware.

## 2019-04-27 NOTE — Telephone Encounter (Signed)
Her appointment from today will need to be rescheduled due to scheduling conflict.  Please let the patient know that I reviewed the following tests.  Stress test does not show any evidence of reduced heart muscle circulation.  Echocardiogram shows normal heart function.  There is mild to moderate leakiness of 2 valves.  This is a benign finding.  I will be happy to see the patient on another day.  Echocardiogram 03/20/2019: Left ventricle cavity is normal in size. Normal left ventricular wall thickness. Normal LV systolic function with EF 57%. Normal global wall motion. Doppler evidence of grade I (impaired) diastolic dysfunction, normal LAP.  Left atrial cavity is moderately dilated. Trileaflet aortic valve. Mild aortic valve leaflet calcification.  Mild (Grade I) aortic regurgitation. Mild tricuspid regurgitation. No evidence of pulmonary hypertension.  Lexiscan Myoview stress test 02/23/2019: Lexiscan stress test was performed. Stress EKG is non-diagnostic, as this is pharmacological stress test. Myocardial perfusion imaging is normal. LVEF 54%. Low risk study.  Thanks MJP

## 2019-05-12 ENCOUNTER — Encounter (HOSPITAL_COMMUNITY): Payer: Self-pay | Admitting: Emergency Medicine

## 2019-05-12 ENCOUNTER — Emergency Department (HOSPITAL_COMMUNITY)
Admission: EM | Admit: 2019-05-12 | Discharge: 2019-05-12 | Discharge status: Against Medical Advice | Payer: Medicare Other | Attending: Emergency Medicine | Admitting: Emergency Medicine

## 2019-05-12 DIAGNOSIS — Z5321 Procedure and treatment not carried out due to patient leaving prior to being seen by health care provider: Secondary | ICD-10-CM | POA: Diagnosis not present

## 2019-05-12 DIAGNOSIS — R112 Nausea with vomiting, unspecified: Secondary | ICD-10-CM | POA: Diagnosis present

## 2019-05-12 LAB — COMPREHENSIVE METABOLIC PANEL
ALT: 12 U/L (ref 0–44)
AST: 20 U/L (ref 15–41)
Albumin: 3.7 g/dL (ref 3.5–5.0)
Alkaline Phosphatase: 84 U/L (ref 38–126)
Anion gap: 9 (ref 5–15)
BUN: 9 mg/dL (ref 8–23)
CO2: 26 mmol/L (ref 22–32)
Calcium: 9.3 mg/dL (ref 8.9–10.3)
Chloride: 107 mmol/L (ref 98–111)
Creatinine, Ser: 0.87 mg/dL (ref 0.44–1.00)
GFR calc Af Amer: >60 mL/min (ref >60)
GFR calc non Af Amer: >60 mL/min (ref >60)
Glucose, Bld: 117 mg/dL — ABNORMAL HIGH (ref 70–99)
Potassium: 4.1 mmol/L (ref 3.5–5.1)
Sodium: 142 mmol/L (ref 135–145)
Total Bilirubin: 0.5 mg/dL (ref 0.3–1.2)
Total Protein: 7.3 g/dL (ref 6.5–8.1)

## 2019-05-12 LAB — CBC
HCT: 42.5 % (ref 36.0–46.0)
Hemoglobin: 13.8 g/dL (ref 12.0–15.0)
MCH: 30.1 pg (ref 26.0–34.0)
MCHC: 32.5 g/dL (ref 30.0–36.0)
MCV: 92.6 fL (ref 80.0–100.0)
Platelets: 298 10*3/uL (ref 150–400)
RBC: 4.59 MIL/uL (ref 3.87–5.11)
RDW: 13 % (ref 11.5–15.5)
WBC: 6.1 10*3/uL (ref 4.0–10.5)
nRBC: 0 % (ref 0.0–0.2)

## 2019-05-12 LAB — URINALYSIS, ROUTINE W REFLEX MICROSCOPIC
Bacteria, UA: NONE SEEN
Bilirubin Urine: NEGATIVE
Glucose, UA: NEGATIVE mg/dL
Ketones, ur: 20 mg/dL — AB
Leukocytes,Ua: NEGATIVE
Nitrite: NEGATIVE
Protein, ur: 30 mg/dL — AB
Specific Gravity, Urine: 1.018 (ref 1.005–1.030)
pH: 7 (ref 5.0–8.0)

## 2019-05-12 LAB — LIPASE, BLOOD: Lipase: 33 U/L (ref 11–51)

## 2019-05-12 MED ORDER — SODIUM CHLORIDE 0.9% FLUSH: 3.0000 mL | Freq: Once | INTRAVENOUS | Status: DC

## 2019-05-12 NOTE — ED Triage Notes (Signed)
Pt here from home with c/o n/v history of same ,pt missed her MD appointment , pt received 4 mg Zofran Im by ems

## 2020-03-21 NOTE — Progress Notes (Deleted)
Primary Physician/Referring:  Jettie Booze, NP  Patient ID: Brooke [name], female    DOB: Mar 22, 1941, 79 y.o.   MRN: [id number]  No chief complaint on file.  HPI:    Brooke [name]  is a 79 y.o. African-American female with history of hypertension, hypercholesterolemia, palpitations, GERD, chronic back pain, dementia.  She was last seen in our office on 02/09/2019 by Dr. Woody Seller for exertional chest pain and shortness of breath.  She underwent nuclear stress testing which was low risk for ischemia.  She also had echocardiogram done 03/20/2019 which revealed normal LVEF of 57%. Patient was unfortunately lost to follow up after her echocardiogram and stress test.   ***Patient presents today for numbness and swelling of her lower extremities.  Past Medical History:  Diagnosis Date  . Anxiety   . Hypertension    Past Surgical History:  Procedure Laterality Date  . BACK SURGERY    . BUNIONECTOMY Left 07/21/2009   Left foot  . CAPSULOTOMY Right 07/21/2009   Rt #2 MPJ  . Hammer Toe Repair Right 07/21/2009   Rt #2  . JOINT REPLACEMENT    . OSTEOTOMY Right 02/03/2015   Rt 5th met  . Tarsal Exostectomy Right 02/03/2015  . TENOTOMY / FLEXOR TENDON TRANSFER Right 07/21/2009   Right foot   No family history on file.  Social History   Tobacco Use  . Smoking status: Never Smoker  . Smokeless tobacco: Never Used  Substance Use Topics  . Alcohol use: No    Alcohol/week: 0.0 standard drinks   Marital Status: Divorced   ROS  ***ROS  Objective  There were no vitals taken for this visit.  Vitals with BMI 05/12/2019 05/12/2019 05/12/2019  Height - - -  Weight - - -  BMI - - -  Systolic 585 277 824  Diastolic 80 87 235  Pulse 74 82 84      ***Physical Exam  Laboratory examination:   CMP Latest Ref Rng & Units 05/12/2019 11/21/2015 11/20/2015  Glucose 70 - 99 mg/dL 117(H) 85 -  BUN 8 - 23 mg/dL 9 11 -  Creatinine 0.44 - 1.00 mg/dL 0.87 0.92 0.73  Sodium 135 - 145 mmol/L 142 140 -   Potassium 3.5 - 5.1 mmol/L 4.1 3.7 -  Chloride 98 - 111 mmol/L 107 110 -  CO2 22 - 32 mmol/L 26 24 -  Calcium 8.9 - 10.3 mg/dL 9.3 8.4(L) -  Total Protein 6.5 - 8.1 g/dL 7.3 - 6.4(L)  Total Bilirubin 0.3 - 1.2 mg/dL 0.5 - 0.4  Alkaline Phos 38 - 126 U/L 84 - 57  AST 15 - 41 U/L 20 - 21  ALT 0 - 44 U/L 12 - 13(L)   CBC Latest Ref Rng & Units 05/12/2019 11/21/2015 11/20/2015  WBC 4.0 - 10.5 K/uL 6.1 4.3 4.8  Hemoglobin 12.0 - 15.0 g/dL 13.8 11.2(L) 11.1(L)  Hematocrit 36 - 46 % 42.5 34.1(L) 33.7(L)  Platelets 150 - 400 K/uL 298 240 234   Lipid Panel  No results found for: CHOL, TRIG, HDL, CHOLHDL, VLDL, LDLCALC, LDLDIRECT HEMOGLOBIN A1C Lab Results  Component Value Date   HGBA1C  07/21/2010    5.5 (NOTE)  According to the ADA Clinical Practice Recommendations for 2011, when HbA1c is used as a screening test:   >=6.5%   Diagnostic of Diabetes Mellitus           (if abnormal result  is confirmed)  5.7-6.4%   Increased risk of developing Diabetes Mellitus  References:Diagnosis and Classification of Diabetes Mellitus,Diabetes EEFE,0712,19(XJOIT 1):S62-S69 and Standards of Medical Care in         Diabetes - 2011,Diabetes GPQD,8264,15  (Suppl 1):S11-S61.   MPG 111 07/21/2010   TSH No results for input(s): TSH in the last 8760 hours.  External labs:  05/12/2019 BUN 9.0, creatinine 0.87  07/11/2016: HDL 80, LDL 150, triglycerides 115, total cholesterol 253 A1c 5.7% TSH 1.61  Medications and allergies   Allergies  Allergen Reactions  . Codeine Nausea And Vomiting  . Demerol [Meperidine] Swelling  . Penicillins Nausea And Vomiting    Has patient had a PCN reaction causing immediate rash, facial/tongue/throat swelling, SOB or lightheadedness with hypotension: No Has patient had a PCN reaction causing severe rash involving mucus membranes or skin necrosis: No Has patient had a PCN reaction that required  hospitalization No Has patient had a PCN reaction occurring within the last 10 years: No If all of the above answers are "NO", then may proceed with Cephalosporin use.   . Tramadol Nausea And Vomiting     Outpatient Medications Prior to Visit  Medication Sig Dispense Refill  . citalopram (CELEXA) 40 MG tablet Take 40 mg by mouth daily.    Marland Kitchen gabapentin (NEURONTIN) 100 MG capsule Take 100 mg by mouth 3 (three) times daily.     . metoprolol succinate (TOPROL-XL) 25 MG 24 hr tablet Take 1 tablet (25 mg total) by mouth daily. 90 tablet 0  . mirtazapine (REMERON) 7.5 MG tablet Take 15 mg by mouth.     . oxyCODONE (ROXICODONE) 15 MG immediate release tablet Take 15 mg by mouth every 6 (six) hours as needed.    . pantoprazole (PROTONIX) 40 MG tablet Take 40 mg by mouth daily.    . temazepam (RESTORIL) 30 MG capsule Take 30 mg by mouth at bedtime as needed. for sleep    . tiZANidine (ZANAFLEX) 4 MG tablet Take 2 mg by mouth 3 times/day as needed-between meals & bedtime.    . vitamin B-12 (CYANOCOBALAMIN) 500 MCG tablet Take 500 mcg by mouth daily.     No facility-administered medications prior to visit.     Radiology:   No results found.  Cardiac Studies:  Echocardiogram 03/20/2019:  Left ventricle cavity is normal in size. Normal left ventricular wall thickness. Normal LV systolic function with EF 57%. Normal global wall motion. Doppler evidence of grade I (impaired) diastolic dysfunction, normal LAP.  Left atrial cavity is moderately dilated.  Trileaflet aortic valve. Mild aortic valve leaflet calcification. Mild (Grade I) aortic regurgitation.  Mild tricuspid regurgitation.  No evidence of pulmonary hypertension.  Lexiscan Myoview stress test 02/23/2019: Lexiscan stress test was performed. Stress EKG is non-diagnostic, as this is pharmacological stress test. Myocardial perfusion imaging is normal. LVEF 54%. Low risk study.  EKG ***  EKG 02/09/2019: Sinus rhythm, nonspecific ST-T  wave abnormality.  Assessment     ICD-10-CM   1. Essential hypertension  I10      There are no discontinued medications.  No orders of the defined types were placed in this encounter.   Recommendations:   Brooke [name] is a 79 y.o. African-American female with history of hypertension, hypercholesterolemia, palpitations,  GERD, chronic back pain, dementia.  She was last seen in our office on 02/09/2019 by Dr. Woody Seller for exertional chest pain and shortness of breath.  She underwent nuclear stress testing which was low risk for ischemia.  She also had echocardiogram done 03/20/2019 which revealed normal LVEF of 57%.   ***Patient presents today for numbness and swelling of her lower extremities.  Echo results reviewed and discussed. Stress test results reviewed and discussed.   Patient was seen in collaboration with Dr. Marland Kitchen He also reviewed patient's chart and examined the patient. Dr. Marland Kitchen is in agreement of the plan.   During this visit I reviewed and updated: Tobacco history  allergies medication reconciliation  medical history  surgical history  family history  social history.  This note was created using a voice recognition software as a result there may be grammatical errors inadvertently enclosed that do not reflect the nature of this encounter. Every attempt is made to correct such errors.   Alethia Berthold, PA-C 03/21/2020, 5:26 PM Office: [phone]

## 2020-03-22 ENCOUNTER — Ambulatory Visit: Payer: Medicare Other | Admitting: Student

## 2020-03-22 NOTE — Progress Notes (Signed)
Primary Physician/Referring:  Brooke Booze, NP  Patient ID: Brooke [name], female    DOB: 07-01-1941, 79 y.o.   MRN: [id number]  Chief Complaint  Patient presents with  . Leg Swelling  . Atrial Flutter  . Congestive Heart Failure   HPI:    Brooke [name]  is a 79 y.o. African-American female with history of hypertension, hypercholesteremia, GERD, chronic back pain, dementia.  Patient was originally referred to our office with complaints of chest pain, shortness of breath, and occasional dizziness.  She was last seen in our office on 02/09/2019 by Dr. Woody Seller.  At this time nuclear stress test and echo were obtained and primary risk factor management was discussed.  However, unfortunately patient was lost to follow-up.  Patient presents today with primary complaint of swelling of the lower extremities.  Patient reports that about a month ago she was seen by the pain clinic and started on oxycodone 3 times daily which has significantly decreased her pain and allowed her to increase her activity.  Since increasing her activity patient has noticed mild bilateral ankle swelling worse during the day when she is up and doing things, she also noticed occasional shortness of breath when she is exerting herself.  This swelling significantly improves when she elevates her legs.  She noted shortness of breath several times when she is laying down in bed at night as well.  She notes that up until a month ago she was relatively sedentary due to pain.  She has also noted occasional nonexertional chest pain lasting a few seconds at a time, this has occurred 3-4 times in the last month.  Patient does admit to high salt diet.  No history of diabetes, stroke, MI. She is currently taking metoprolol succinate 25 mg once daily. Of note patient does report that she snores at night.     Past Medical History:  Diagnosis Date  . Anxiety   . Hypertension    Past Surgical History:  Procedure Laterality Date  . BACK  SURGERY    . BUNIONECTOMY Left 07/21/2009   Left foot  . CAPSULOTOMY Right 07/21/2009   Rt #2 MPJ  . CHOLECYSTECTOMY    . Hammer Toe Repair Right 07/21/2009   Rt #2  . JOINT REPLACEMENT    . OSTEOTOMY Right 02/03/2015   Rt 5th met  . Tarsal Exostectomy Right 02/03/2015  . TENOTOMY / FLEXOR TENDON TRANSFER Right 07/21/2009   Right foot   History reviewed. No pertinent family history.  Social History   Tobacco Use  . Smoking status: Never Smoker  . Smokeless tobacco: Never Used  Substance Use Topics  . Alcohol use: No    Alcohol/week: 0.0 standard drinks   Marital Status: Divorced   ROS  Review of Systems  Cardiovascular: Positive for chest pain, dyspnea on exertion, leg swelling and orthopnea. Negative for claudication, palpitations, paroxysmal nocturnal dyspnea and syncope.  Respiratory: Positive for snoring.   Gastrointestinal: Negative for heartburn.  Neurological: Negative for dizziness.    Objective  Blood pressure (!) 144/82, pulse 76, resp. rate 17, height 5' (1.524 m), weight 138 lb (62.6 kg), SpO2 95 %.  Vitals with BMI 03/23/2020 05/12/2019 05/12/2019  Height _0  - -  Weight 138 lbs - -  BMI 73.42 - -  Systolic 876 811 572  Diastolic 82 80 87  Pulse 76 74 82     Physical Exam Vitals reviewed.  Constitutional:      Appearance: She is normal weight.  Cardiovascular:     Rate and Rhythm: Normal rate and regular rhythm.     Pulses:          Carotid pulses are 2+ on the right side with bruit and 2+ on the left side.      Radial pulses are 2+ on the right side and 2+ on the left side.       Femoral pulses are 1+ on the right side and 1+ on the left side.      Popliteal pulses are 1+ on the right side and 1+ on the left side.       Dorsalis pedis pulses are 0 on the right side and 0 on the left side.       Posterior tibial pulses are 2+ on the right side and 2+ on the left side.     Heart sounds: S1 normal and S2 normal.     Comments: Trace bilateral non-pitting  ankle edema.  No JVD.  Pulmonary:     Effort: Pulmonary effort is normal.     Breath sounds: Normal breath sounds. No wheezing, rhonchi or rales.  Abdominal:     General: Bowel sounds are normal.     Palpations: Abdomen is soft.     Laboratory examination:   Recent Labs    05/12/19 1329  NA 142  K 4.1  CL 107  CO2 26  GLUCOSE 117*  BUN 9  CREATININE 0.87  CALCIUM 9.3  GFRNONAA >60  GFRAA >60   CrCl cannot be calculated (Patient's most recent lab result is older than the maximum 21 days allowed.).  CMP Latest Ref Rng & Units 05/12/2019 11/21/2015 11/20/2015  Glucose 70 - 99 mg/dL 117(H) 85 -  BUN 8 - 23 mg/dL 9 11 -  Creatinine 0.44 - 1.00 mg/dL 0.87 0.92 0.73  Sodium 135 - 145 mmol/L 142 140 -  Potassium 3.5 - 5.1 mmol/L 4.1 3.7 -  Chloride 98 - 111 mmol/L 107 110 -  CO2 22 - 32 mmol/L 26 24 -  Calcium 8.9 - 10.3 mg/dL 9.3 8.4(L) -  Total Protein 6.5 - 8.1 g/dL 7.3 - 6.4(L)  Total Bilirubin 0.3 - 1.2 mg/dL 0.5 - 0.4  Alkaline Phos 38 - 126 U/L 84 - 57  AST 15 - 41 U/L 20 - 21  ALT 0 - 44 U/L 12 - 13(L)   CBC Latest Ref Rng & Units 05/12/2019 11/21/2015 11/20/2015  WBC 4.0 - 10.5 K/uL 6.1 4.3 4.8  Hemoglobin 12.0 - 15.0 g/dL 13.8 11.2(L) 11.1(L)  Hematocrit 36 - 46 % 42.5 34.1(L) 33.7(L)  Platelets 150 - 400 K/uL 298 240 234    Lipid Panel No results for input(s): CHOL, TRIG, LDLCALC, VLDL, HDL, CHOLHDL, LDLDIRECT in the last 8760 hours.  HEMOGLOBIN A1C Lab Results  Component Value Date   HGBA1C  07/21/2010    5.5 (NOTE)                                                                       According to the ADA Clinical Practice Recommendations for 2011, when HbA1c is used as a screening test:   >=6.5%   Diagnostic of Diabetes Mellitus           (if  abnormal result  is confirmed)  5.7-6.4%   Increased risk of developing Diabetes Mellitus  References:Diagnosis and Classification of Diabetes Mellitus,Diabetes SHFW,2637,85(YIFOY 1):S62-S69 and Standards of Medical  Care in         Diabetes - 2011,Diabetes [id number],2878,67  (Suppl 1):S11-S61.   MPG 111 07/21/2010   TSH No results for input(s): TSH in the last 8760 hours.  External labs:  03/30/2019: Total cholesterol 159, triglycerides 103, HDL 59, LDL 81  07/11/2016: HDL 80, LDL 150, triglycerides 115, total cholesterol 253 TSH 1.61 Hemoglobin A1c 5.7%  Medications and allergies   Allergies  Allergen Reactions  . Codeine Nausea And Vomiting  . Demerol [Meperidine] Swelling  . Penicillins Nausea And Vomiting    Has patient had a PCN reaction causing immediate rash, facial/tongue/throat swelling, SOB or lightheadedness with hypotension: No Has patient had a PCN reaction causing severe rash involving mucus membranes or skin necrosis: No Has patient had a PCN reaction that required hospitalization No Has patient had a PCN reaction occurring within the last 10 years: No If all of the above answers are "NO", then may proceed with Cephalosporin use.   . Tramadol Nausea And Vomiting     Outpatient Medications Prior to Visit  Medication Sig Dispense Refill  . albuterol (PROVENTIL) (2.5 MG/3ML) 0.083% nebulizer solution Take by nebulization as needed.    Marland Kitchen amoxicillin (AMOXIL) 500 MG capsule Take 500 mg by mouth 3 (three) times daily.    . ARIPiprazole (ABILIFY) 2 MG tablet Take 2 mg by mouth daily.    Marland Kitchen gabapentin (NEURONTIN) 100 MG capsule Take 100 mg by mouth 3 (three) times daily.     Marland Kitchen LINZESS 145 MCG CAPS capsule Take 145 mcg by mouth every morning.    . metoprolol succinate (TOPROL-XL) 25 MG 24 hr tablet Take 1 tablet (25 mg total) by mouth daily. 90 tablet 0  . mirtazapine (REMERON) 7.5 MG tablet Take 15 mg by mouth.     Karma Greaser 4 MG/0.1ML LIQD nasal spray kit daily.    . ondansetron (ZOFRAN-ODT) 4 MG disintegrating tablet Take 1 tablet by mouth every 8 (eight) hours as needed.    Marland Kitchen oxyCODONE (ROXICODONE) 15 MG immediate release tablet Take 15 mg by mouth every 6 (six) hours as needed.      . pantoprazole (PROTONIX) 40 MG tablet Take 40 mg by mouth daily.    . temazepam (RESTORIL) 30 MG capsule Take 30 mg by mouth at bedtime as needed. for sleep    . tiZANidine (ZANAFLEX) 4 MG tablet Take 2 mg by mouth 3 times/day as needed-between meals & bedtime.    . vitamin B-12 (CYANOCOBALAMIN) 500 MCG tablet Take 500 mcg by mouth daily.    . citalopram (CELEXA) 40 MG tablet Take 40 mg by mouth daily.     No facility-administered medications prior to visit.     Radiology:   No results found.  Cardiac Studies:   Chest x-ray 01/01/2020 (external): There is no acute infiltrate.  There is no pleural effusion.  The cardiac silhouette is unremarkable.  The visualized osseous structures demonstrate no gross abnormality.  CT chest with IV contrast 08/21/2019 (external): 1. Negative for acute pulmonary infiltrate.  2. Mild dependent atelectasis.  3. Nonspecific partially visualized extensive edematous changes of the left arm. An underlying cellulitis cannot be excluded. Consider correlationwith a left arm ultrasound, including duplex venous exam. No definite underlying thrombus identified.   Chest x-ray 08/21/2019 (external): FINDINGS:  Support devices: Interval removal of the right  IJ central line.  Lungs and pleura: No focal consolidation, mass, or pulmonary edema. No definite pleural effusion. No pneumothorax.  Heart and mediastinum: Heart size normal. Mediastinal and hilar contours unchanged, and within normal limits.  Bones: No acute fracture or aggressive-appearing osseous lesion.  Lexiscan Myoview stress test 02/23/2019: Lexiscan stress test was performed. Stress EKG is non-diagnostic, as this is pharmacological stress test. Myocardial perfusion imaging is normal. LVEF 54%. Low risk study.  Echocardiogram 03/20/2019:  Left ventricle cavity is normal in size. Normal left ventricular wall thickness. Normal LV systolic function with EF 57%. Normal global wall motion.  Doppler evidence of grade I (impaired) diastolic dysfunction, normal LAP.  Left atrial cavity is moderately dilated.  Trileaflet aortic valve. Mild aortic valve leaflet calcification. Mild (Grade I) aortic regurgitation.  Mild tricuspid regurgitation.  No evidence of pulmonary hypertension.  EKG EKG 03/23/2020: sinus rhythm at 67 bpm, left atrial enlargement. Normal axis. Nonspecific T wave abnormality.  Compared to EKG 02/09/2019, left atrial enlargement new.   Assessment     ICD-10-CM   1. Essential hypertension  I10 hydrochlorothiazide (MICROZIDE) 12.5 MG capsule    PCV ECHOCARDIOGRAM COMPLETE    Basic metabolic panel    TSH  2. Shortness of breath  R06.02 EKG 12-Lead    PCV ECHOCARDIOGRAM COMPLETE    TSH  3. Chest pain, atypical  R07.89   4. Bilateral leg edema  R60.0   5. Bruit of right carotid artery  R09.89 PCV CAROTID DUPLEX (BILATERAL)    Lipid Panel With LDL/HDL Ratio  6. Hypercholesterolemia  E78.00 PCV CAROTID DUPLEX (BILATERAL)    Lipid Panel With LDL/HDL Ratio     There are no discontinued medications.  Meds ordered this encounter  Medications  . hydrochlorothiazide (MICROZIDE) 12.5 MG capsule    Sig: Take 1 capsule (12.5 mg total) by mouth daily.    Dispense:  30 capsule    Refill:  3    Recommendations:   TYLIN STRADLEY is a 79 y.o. African-American female with history of hypertension, hypercholesteremia, GERD, chronic back pain, dementia.  Patient was originally referred to our office with complaints of chest pain, shortness of breath, and occasional dizziness.  She was last seen in our office on 02/09/2019 by Dr. Woody Seller.  At this time nuclear stress test and echo were obtained and primary risk factor management was discussed.  However, unfortunately patient was lost to follow-up.  Patient presents today with primary complaints dyspnea on exertion, bilateral ankle edema, occasional atypical chest pain.  Suspect symptoms are multifactorial. In view of patient's  extended inactivity due to pain for several months, and her recently resumption of activity coinciding with symptom onset suspect deconditioning to be contributing to symptoms.  It is likely that her high salt diet and uncontrolled hypertension are contributing as well.  Will obtain echocardiogram to evaluate for heart structural or functional contribution.  Blood pressure is elevated, and patient's renal function is normal.  Will start patient on hydrochlorothiazide 12.5 mg once daily and check BMP in 2 weeks.  Right carotid bruit noted on exam is new, will obtain bilateral carotid duplex in order to evaluate.  Last lipid profile testing on 03/30/2019 revealed lipids well controlled, however patient's ASCVD is greater than 7.5%. Will need lipid management in the future.   Follow-up in 4 weeks for dyspnea, edema, chest pain, and to discuss results of echocardiogram and carotid duplex.  Patient was seen in collaboration with Dr. Virgina Jock. He also reviewed patient's chart and Dr. Virgina Jock  is in agreement of the plan.   During this visit I reviewed and updated: Tobacco history  allergies medication reconciliation  medical history  surgical history  family history  social history.  This note was created using a voice recognition software as a result there may be grammatical errors inadvertently enclosed that do not reflect the nature of this encounter. Every attempt is made to correct such errors.   This was a 40-minute encounter with face-to-face counseling, medical records review, coordination of care, explanation of complex medical issues, complex medical decision making.     Alethia Berthold, PA-C 03/23/2020, 3:47 PM Office: [phone]

## 2020-03-23 ENCOUNTER — Other Ambulatory Visit: Payer: Self-pay

## 2020-03-23 ENCOUNTER — Ambulatory Visit: Payer: Medicare Other | Admitting: Student

## 2020-03-23 ENCOUNTER — Encounter: Payer: Self-pay | Admitting: Student

## 2020-03-23 VITALS — BP 144/82 | HR 76 | Resp 17 | Ht 60.0 in | Wt 138.0 lb

## 2020-03-23 DIAGNOSIS — I10 Essential (primary) hypertension: Secondary | ICD-10-CM

## 2020-03-23 DIAGNOSIS — R600 Localized edema: Secondary | ICD-10-CM

## 2020-03-23 DIAGNOSIS — E7800 Pure hypercholesterolemia, unspecified: Secondary | ICD-10-CM

## 2020-03-23 DIAGNOSIS — R0789 Other chest pain: Secondary | ICD-10-CM

## 2020-03-23 DIAGNOSIS — R0602 Shortness of breath: Secondary | ICD-10-CM

## 2020-03-23 DIAGNOSIS — R0989 Other specified symptoms and signs involving the circulatory and respiratory systems: Secondary | ICD-10-CM

## 2020-03-23 MED ORDER — HYDROCHLOROTHIAZIDE 12.5 MG PO CAPS: 12.5000 mg | ORAL_CAPSULE | Freq: Every day | ORAL | 3 refills | Status: DC

## 2020-03-23 NOTE — Patient Instructions (Signed)
Please go to any [facility] and get blood work done in about 2 weeks.

## 2020-03-29 ENCOUNTER — Telehealth: Payer: Self-pay

## 2020-03-29 NOTE — Telephone Encounter (Signed)
Pt's caregiver called and said that the patients BP is now 85/35 heartrate is 80. per Celeste, pt can hold her hctz and we will check her BP 10/6 when pt is scheduled for echo in out office

## 2020-04-06 ENCOUNTER — Other Ambulatory Visit: Payer: Self-pay

## 2020-04-06 ENCOUNTER — Ambulatory Visit: Payer: Medicare Other

## 2020-04-06 ENCOUNTER — Other Ambulatory Visit: Payer: Medicare Other

## 2020-04-06 DIAGNOSIS — E7800 Pure hypercholesterolemia, unspecified: Secondary | ICD-10-CM

## 2020-04-06 DIAGNOSIS — R0602 Shortness of breath: Secondary | ICD-10-CM

## 2020-04-06 DIAGNOSIS — R0989 Other specified symptoms and signs involving the circulatory and respiratory systems: Secondary | ICD-10-CM

## 2020-04-06 DIAGNOSIS — I10 Essential (primary) hypertension: Secondary | ICD-10-CM

## 2020-04-07 LAB — LIPID PANEL WITH LDL/HDL RATIO
Cholesterol, Total: 255 mg/dL — ABNORMAL HIGH (ref 100–199)
HDL: 60 mg/dL (ref >39)
LDL Chol Calc (NIH): 166 mg/dL — ABNORMAL HIGH (ref 0–99)
LDL/HDL Ratio: 2.8 ratio (ref 0.0–3.2)
Triglycerides: 159 mg/dL — ABNORMAL HIGH (ref 0–149)
VLDL Cholesterol Cal: 29 mg/dL (ref 5–40)

## 2020-04-07 LAB — TSH: TSH: 3.1 u[IU]/mL (ref 0.450–4.500)

## 2020-04-07 NOTE — Progress Notes (Signed)
Called pt and care taker was informed of pt lab results

## 2020-04-07 NOTE — Progress Notes (Signed)
Please inform patient her thyroid function is normal. Her cholesterol is high, we will discuss further at upcoming visit.

## 2020-04-11 NOTE — Progress Notes (Signed)
Please inform patient her echo is similar to her previous. Normal heart function with some mild muscle thickening. Will also discuss at upcoming visit.

## 2020-04-11 NOTE — Progress Notes (Signed)
Relayed information to patient. Patient voiced understanding.  

## 2020-04-11 NOTE — Progress Notes (Signed)
Please inform patient there is minimal blockage in her carotid arteries. We will continue to monitor clinically and discuss further at her upcoming appointment.

## 2020-04-15 ENCOUNTER — Ambulatory Visit: Payer: Medicare Other | Admitting: Student

## 2020-04-19 NOTE — Progress Notes (Deleted)
Primary Physician/Referring:  Jettie Booze, NP  Patient ID: Brooke [name], female    DOB: 06-23-1941, 79 y.o.   MRN: [id number]  No chief complaint on file.  HPI:    Brooke [name]  is a 79 y.o. African-American female with history of hypertension, hypercholesteremia, GERD, chronic back pain, dementia.  Patient was originally referred to our office with complaints of chest pain, shortness of breath, and occasional dizziness.  She was last seen in our office on 02/09/2019 by Dr. Woody Seller.  At this time nuclear stress test and echo were obtained and primary risk factor management was discussed.  However, unfortunately patient was lost to follow-up.  Patient presents today with primary complaint of swelling of the lower extremities.  Patient reports that about a month ago she was seen by the pain clinic and started on oxycodone 3 times daily which has significantly decreased her pain and allowed her to increase her activity.  Since increasing her activity patient has noticed mild bilateral ankle swelling worse during the day when she is up and doing things, she also noticed occasional shortness of breath when she is exerting herself.  This swelling significantly improves when she elevates her legs.  She noted shortness of breath several times when she is laying down in bed at night as well.  She notes that up until a month ago she was relatively sedentary due to pain.  She has also noted occasional nonexertional chest pain lasting a few seconds at a time, this has occurred 3-4 times in the last month.  Patient does admit to high salt diet.  No history of diabetes, stroke, MI. She is currently taking metoprolol succinate 25 mg once daily. Of note patient does report that she snores at night.   ***Patient presents for follow-up.  I last visit started patient on hydrochlorothiazide 12.5 mg once daily however her blood pressure became low and patient experienced dizziness and fatigue, she was therefore  instructed to stop HCTZ.  Past Medical History:  Diagnosis Date  . Anxiety   . Hypertension    Past Surgical History:  Procedure Laterality Date  . BACK SURGERY    . BUNIONECTOMY Left 07/21/2009   Left foot  . CAPSULOTOMY Right 07/21/2009   Rt #2 MPJ  . CHOLECYSTECTOMY    . Hammer Toe Repair Right 07/21/2009   Rt #2  . JOINT REPLACEMENT    . OSTEOTOMY Right 02/03/2015   Rt 5th met  . Tarsal Exostectomy Right 02/03/2015  . TENOTOMY / FLEXOR TENDON TRANSFER Right 07/21/2009   Right foot   No family history on file.  Social History   Tobacco Use  . Smoking status: Never Smoker  . Smokeless tobacco: Never Used  Substance Use Topics  . Alcohol use: No    Alcohol/week: 0.0 standard drinks   Marital Status: Divorced   ROS  Review of Systems  Cardiovascular: Positive for chest pain, dyspnea on exertion, leg swelling and orthopnea. Negative for claudication, palpitations, paroxysmal nocturnal dyspnea and syncope.  Respiratory: Positive for snoring.   Gastrointestinal: Negative for heartburn.  Neurological: Negative for dizziness.    Objective  There were no vitals taken for this visit.  Vitals with BMI 03/23/2020 05/12/2019 05/12/2019  Height '5\' 0"'  - -  Weight 138 lbs - -  BMI 48.88 - -  Systolic [phone]  Diastolic 82 80 87  Pulse 76 74 82     Physical Exam Vitals reviewed.  Constitutional:  Appearance: She is normal weight.  Cardiovascular:     Rate and Rhythm: Normal rate and regular rhythm.     Pulses:          Carotid pulses are 2+ on the right side with bruit and 2+ on the left side.      Radial pulses are 2+ on the right side and 2+ on the left side.       Femoral pulses are 1+ on the right side and 1+ on the left side.      Popliteal pulses are 1+ on the right side and 1+ on the left side.       Dorsalis pedis pulses are 0 on the right side and 0 on the left side.       Posterior tibial pulses are 2+ on the right side and 2+ on the left side.     Heart  sounds: S1 normal and S2 normal.     Comments: Trace bilateral non-pitting ankle edema.  No JVD.  Pulmonary:     Effort: Pulmonary effort is normal.     Breath sounds: Normal breath sounds. No wheezing, rhonchi or rales.  Abdominal:     General: Bowel sounds are normal.     Palpations: Abdomen is soft.     Laboratory examination:   Recent Labs    05/12/19 1329  NA 142  K 4.1  CL 107  CO2 26  GLUCOSE 117*  BUN 9  CREATININE 0.87  CALCIUM 9.3  GFRNONAA >60  GFRAA >60   CrCl cannot be calculated (Patient's most recent lab result is older than the maximum 21 days allowed.).  CMP Latest Ref Rng & Units 05/12/2019 11/21/2015 11/20/2015  Glucose 70 - 99 mg/dL 117(H) 85 -  BUN 8 - 23 mg/dL 9 11 -  Creatinine 0.44 - 1.00 mg/dL 0.87 0.92 0.73  Sodium 135 - 145 mmol/L 142 140 -  Potassium 3.5 - 5.1 mmol/L 4.1 3.7 -  Chloride 98 - 111 mmol/L 107 110 -  CO2 22 - 32 mmol/L 26 24 -  Calcium 8.9 - 10.3 mg/dL 9.3 8.4(L) -  Total Protein 6.5 - 8.1 g/dL 7.3 - 6.4(L)  Total Bilirubin 0.3 - 1.2 mg/dL 0.5 - 0.4  Alkaline Phos 38 - 126 U/L 84 - 57  AST 15 - 41 U/L 20 - 21  ALT 0 - 44 U/L 12 - 13(L)   CBC Latest Ref Rng & Units 05/12/2019 11/21/2015 11/20/2015  WBC 4.0 - 10.5 K/uL 6.1 4.3 4.8  Hemoglobin 12.0 - 15.0 g/dL 13.8 11.2(L) 11.1(L)  Hematocrit 36 - 46 % 42.5 34.1(L) 33.7(L)  Platelets 150 - 400 K/uL 298 240 234    Lipid Panel Recent Labs    04/06/20 1308  CHOL 255*  TRIG 159*  LDLCALC 166*  HDL 60    HEMOGLOBIN A1C Lab Results  Component Value Date   HGBA1C  07/21/2010    5.5 (NOTE)                                                                       According to the ADA Clinical Practice Recommendations for 2011, when HbA1c is used as a screening test:   >=6.5%   Diagnostic of Diabetes Mellitus           (  if abnormal result  is confirmed)  5.7-6.4%   Increased risk of developing Diabetes Mellitus  References:Diagnosis and Classification of Diabetes Mellitus,Diabetes  [id number],4944,96(PRFFM 1):S62-S69 and Standards of Medical Care in         Diabetes - 2011,Diabetes [id number],35  (Suppl 1):S11-S61.   MPG 111 07/21/2010   TSH Recent Labs    Apr 20, 2020 1308  TSH 3.100    External labs:  03/30/2019: Total cholesterol 159, triglycerides 103, HDL 59, LDL 81  07/11/2016: HDL 80, LDL 150, triglycerides 115, total cholesterol 253 TSH 1.61 Hemoglobin A1c 5.7%  Medications and allergies   Allergies  Allergen Reactions  . Codeine Nausea And Vomiting  . Demerol [Meperidine] Swelling  . Penicillins Nausea And Vomiting    Has patient had a PCN reaction causing immediate rash, facial/tongue/throat swelling, SOB or lightheadedness with hypotension: No Has patient had a PCN reaction causing severe rash involving mucus membranes or skin necrosis: No Has patient had a PCN reaction that required hospitalization No Has patient had a PCN reaction occurring within the last 10 years: No If all of the above answers are "NO", then may proceed with Cephalosporin use.   . Tramadol Nausea And Vomiting     Outpatient Medications Prior to Visit  Medication Sig Dispense Refill  . albuterol (PROVENTIL) (2.5 MG/3ML) 0.083% nebulizer solution Take by nebulization as needed.    Marland Kitchen amoxicillin (AMOXIL) 500 MG capsule Take 500 mg by mouth 3 (three) times daily.    . ARIPiprazole (ABILIFY) 2 MG tablet Take 2 mg by mouth daily.    . citalopram (CELEXA) 40 MG tablet Take 40 mg by mouth daily.    Marland Kitchen gabapentin (NEURONTIN) 100 MG capsule Take 100 mg by mouth 3 (three) times daily.     Marland Kitchen LINZESS 145 MCG CAPS capsule Take 145 mcg by mouth every morning.    . metoprolol succinate (TOPROL-XL) 25 MG 24 hr tablet Take 1 tablet (25 mg total) by mouth daily. 90 tablet 0  . mirtazapine (REMERON) 7.5 MG tablet Take 15 mg by mouth.     Karma Greaser 4 MG/0.1ML LIQD nasal spray kit daily.    . ondansetron (ZOFRAN-ODT) 4 MG disintegrating tablet Take 1 tablet by mouth every 8 (eight) hours as needed.     Marland Kitchen oxyCODONE (ROXICODONE) 15 MG immediate release tablet Take 15 mg by mouth every 6 (six) hours as needed.    . pantoprazole (PROTONIX) 40 MG tablet Take 40 mg by mouth daily.    . temazepam (RESTORIL) 30 MG capsule Take 30 mg by mouth at bedtime as needed. for sleep    . tiZANidine (ZANAFLEX) 4 MG tablet Take 2 mg by mouth 3 times/day as needed-between meals & bedtime.    . vitamin B-12 (CYANOCOBALAMIN) 500 MCG tablet Take 500 mcg by mouth daily.     No facility-administered medications prior to visit.     Radiology:   No results found.  Cardiac Studies:   Carotid artery duplex 2020/04/20:  Minimal stenosis in the right internal carotid artery (1-15%). Stenosis in the right common carotid artery (<50%) with mostly homogeneous plaque.  Minimal stenosis in the left internal carotid artery (minimal). Stenosis in the left external carotid artery (<50%). Mostly homogeneous plaque.  Antegrade right vertebral artery flow. Antegrade left vertebral artery flow.  Follow up studies if clinically indicated.  PCV ECHOCARDIOGRAM COMPLETE 04-20-2020  Narrative Echocardiogram Apr 20, 2020: Left ventricle cavity is normal in size. Mild concentric hypertrophy of the left ventricle. Normal global wall motion. Normal LV systolic  function with visual EF 50-55%. Normal diastolic filling pattern. Left atrial cavity is mildly dilated. Trileaflet aortic valve.  Mild (Grade I) aortic regurgitation. Mild (Grade I) mitral regurgitation. Mild tricuspid regurgitation. No evidence of pulmonary hypertension. No significant change compared to previous study in 2018.  Chest x-ray 01/01/2020 (external): There is no acute infiltrate.  There is no pleural effusion.  The cardiac silhouette is unremarkable.  The visualized osseous structures demonstrate no gross abnormality.  CT chest with IV contrast 08/21/2019 (external): 1. Negative for acute pulmonary infiltrate.  2. Mild dependent atelectasis.  3.  Nonspecific partially visualized extensive edematous changes of the left arm. An underlying cellulitis cannot be excluded. Consider correlationwith a left arm ultrasound, including duplex venous exam. No definite underlying thrombus identified.   Chest x-ray 08/21/2019 (external): FINDINGS:  Support devices: Interval removal of the right IJ central line.  Lungs and pleura: No focal consolidation, mass, or pulmonary edema. No definite pleural effusion. No pneumothorax.  Heart and mediastinum: Heart size normal. Mediastinal and hilar contours unchanged, and within normal limits.  Bones: No acute fracture or aggressive-appearing osseous lesion.  Lexiscan Myoview stress test 02/23/2019: Lexiscan stress test was performed. Stress EKG is non-diagnostic, as this is pharmacological stress test. Myocardial perfusion imaging is normal. LVEF 54%. Low risk study.  EKG EKG 03/23/2020: sinus rhythm at 67 bpm, left atrial enlargement. Normal axis. Nonspecific T wave abnormality.  Compared to EKG 02/09/2019, left atrial enlargement new.   Assessment     ICD-10-CM   1. Essential hypertension  I10   2. Carotid artery stenosis, asymptomatic, bilateral  I65.23   3. Hypercholesterolemia  E78.00   4. Shortness of breath  R06.02   5. Bilateral leg edema  R60.0   6. Chest pain, atypical  R07.89      There are no discontinued medications.  No orders of the defined types were placed in this encounter.   Recommendations:   Brooke [name] is a 79 y.o. African-American female with history of hypertension, hypercholesteremia, GERD, chronic back pain, dementia.  Patient was originally referred to our office with complaints of chest pain, shortness of breath, and occasional dizziness.  She was last seen in our office on 02/09/2019 by Dr. Woody Seller.  At this time nuclear stress test and echo were obtained and primary risk factor management was discussed.  However, unfortunately patient was lost to  follow-up.  Patient presents today with primary complaints dyspnea on exertion, bilateral ankle edema, occasional atypical chest pain.  Suspect symptoms are multifactorial. In view of patient's extended inactivity due to pain for several months, and her recently resumption of activity coinciding with symptom onset suspect deconditioning to be contributing to symptoms.  It is likely that her high salt diet and uncontrolled hypertension are contributing as well.  Will obtain echocardiogram to evaluate for heart structural or functional contribution.  Blood pressure is elevated, and patient's renal function is normal.  Will start patient on hydrochlorothiazide 12.5 mg once daily and check BMP in 2 weeks.  Right carotid bruit noted on exam is new, will obtain bilateral carotid duplex in order to evaluate.  Last lipid profile testing on 03/30/2019 revealed lipids well controlled, however patient's ASCVD is greater than 7.5%. Will need lipid management in the future.   Follow-up in 4 weeks for dyspnea, edema, chest pain, and to discuss results of echocardiogram and carotid duplex.  *** BP controlled? Needs statin. atorvustatin 70m? Echo and carotid results. Start arb?   During this visit I reviewed and updated:  Tobacco history  allergies medication reconciliation  medical history  surgical history  family history  social history.  This note was created using a voice recognition software as a result there may be grammatical errors inadvertently enclosed that do not reflect the nature of this encounter. Every attempt is made to correct such errors.

## 2020-04-20 ENCOUNTER — Telehealth: Payer: Self-pay | Admitting: Student

## 2020-04-20 ENCOUNTER — Ambulatory Visit: Payer: Medicare Other | Admitting: Student

## 2020-04-20 DIAGNOSIS — I6523 Occlusion and stenosis of bilateral carotid arteries: Secondary | ICD-10-CM

## 2020-04-20 DIAGNOSIS — E7800 Pure hypercholesterolemia, unspecified: Secondary | ICD-10-CM

## 2020-04-20 DIAGNOSIS — R0789 Other chest pain: Secondary | ICD-10-CM

## 2020-04-20 DIAGNOSIS — R600 Localized edema: Secondary | ICD-10-CM

## 2020-04-20 DIAGNOSIS — R0602 Shortness of breath: Secondary | ICD-10-CM

## 2020-04-20 DIAGNOSIS — I10 Essential (primary) hypertension: Secondary | ICD-10-CM

## 2020-04-20 NOTE — Telephone Encounter (Signed)
Patient did not show up to her appointment today, therefore I called her to check in.  She states she got the dates mixup in her calendar.  I have instructed the office to call her to reschedule so that I can see her this week or next as I would like to follow up on her hypertension.
